# Patient Record
Sex: Female | Born: 1980 | Race: Black or African American | Hispanic: No | Marital: Married | Smoking: Never smoker
Health system: Southern US, Community
[De-identification: ages and names within clinical notes are randomized; demographics above are authoritative.]

## PROBLEM LIST (undated history)

## (undated) DIAGNOSIS — K589 Irritable bowel syndrome without diarrhea: Secondary | ICD-10-CM

## (undated) DIAGNOSIS — M25519 Pain in unspecified shoulder: Secondary | ICD-10-CM

## (undated) DIAGNOSIS — R002 Palpitations: Secondary | ICD-10-CM

## (undated) DIAGNOSIS — G8929 Other chronic pain: Secondary | ICD-10-CM

## (undated) DIAGNOSIS — K219 Gastro-esophageal reflux disease without esophagitis: Secondary | ICD-10-CM

## (undated) HISTORY — PX: ESOPHAGOGASTRECTOMY: SHX1528

## (undated) HISTORY — DX: Irritable bowel syndrome, unspecified: K58.9

## (undated) HISTORY — DX: Gastro-esophageal reflux disease without esophagitis: K21.9

## (undated) HISTORY — PX: SHOULDER SURGERY: SHX246

---
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
## 2002-06-23 ENCOUNTER — Encounter: Payer: Self-pay | Admitting: Emergency Medicine

## 2002-06-23 ENCOUNTER — Emergency Department (HOSPITAL_COMMUNITY): Admission: EM | Admit: 2002-06-23 | Discharge: 2002-06-23 | Payer: Self-pay | Admitting: Emergency Medicine

## 2006-03-03 ENCOUNTER — Emergency Department: Payer: Self-pay | Admitting: Emergency Medicine

## 2006-09-10 ENCOUNTER — Ambulatory Visit: Payer: Self-pay | Admitting: Emergency Medicine

## 2006-09-19 ENCOUNTER — Inpatient Hospital Stay: Payer: Self-pay | Admitting: Obstetrics and Gynecology

## 2007-12-01 ENCOUNTER — Ambulatory Visit: Payer: Self-pay | Admitting: Family Medicine

## 2008-04-30 ENCOUNTER — Ambulatory Visit: Payer: Self-pay | Admitting: Internal Medicine

## 2009-07-15 ENCOUNTER — Ambulatory Visit: Payer: Self-pay

## 2009-07-16 ENCOUNTER — Ambulatory Visit: Payer: Self-pay | Admitting: Unknown Physician Specialty

## 2009-07-22 ENCOUNTER — Observation Stay: Payer: Self-pay

## 2009-07-26 ENCOUNTER — Observation Stay: Payer: Self-pay | Admitting: Obstetrics and Gynecology

## 2009-08-30 ENCOUNTER — Inpatient Hospital Stay: Payer: Self-pay

## 2010-02-02 ENCOUNTER — Emergency Department: Payer: Self-pay | Admitting: Emergency Medicine

## 2010-07-27 ENCOUNTER — Ambulatory Visit: Payer: Self-pay | Admitting: Internal Medicine

## 2010-08-19 HISTORY — PX: CHOLECYSTECTOMY: SHX55

## 2010-08-28 ENCOUNTER — Emergency Department: Payer: Self-pay | Admitting: Unknown Physician Specialty

## 2010-09-09 ENCOUNTER — Ambulatory Visit: Payer: Self-pay | Admitting: Surgery

## 2010-09-13 LAB — PATHOLOGY REPORT

## 2010-10-31 ENCOUNTER — Ambulatory Visit: Payer: Self-pay | Admitting: Surgery

## 2011-01-20 ENCOUNTER — Ambulatory Visit: Payer: Self-pay | Admitting: Gastroenterology

## 2013-06-05 ENCOUNTER — Ambulatory Visit: Payer: Self-pay

## 2013-06-05 LAB — CBC WITH DIFFERENTIAL/PLATELET
BASOS PCT: 0.2 %
Basophil #: 0 10*3/uL (ref 0.0–0.1)
EOS ABS: 0 10*3/uL (ref 0.0–0.7)
EOS PCT: 0.2 %
HCT: 41.8 % (ref 35.0–47.0)
HGB: 14 g/dL (ref 12.0–16.0)
Lymphocyte #: 0.2 10*3/uL — ABNORMAL LOW (ref 1.0–3.6)
Lymphocyte %: 2 %
MCH: 30 pg (ref 26.0–34.0)
MCHC: 33.6 g/dL (ref 32.0–36.0)
MCV: 89 fL (ref 80–100)
Monocyte #: 0.3 x10 3/mm (ref 0.2–0.9)
Monocyte %: 2.7 %
Neutrophil #: 10.2 10*3/uL — ABNORMAL HIGH (ref 1.4–6.5)
Neutrophil %: 94.9 %
PLATELETS: 155 10*3/uL (ref 150–440)
RBC: 4.69 10*6/uL (ref 3.80–5.20)
RDW: 13.3 % (ref 11.5–14.5)
WBC: 10.8 10*3/uL (ref 3.6–11.0)

## 2013-06-05 LAB — COMPREHENSIVE METABOLIC PANEL
ALBUMIN: 3.9 g/dL (ref 3.4–5.0)
ANION GAP: 8 (ref 7–16)
AST: 14 U/L — AB (ref 15–37)
Alkaline Phosphatase: 99 U/L
BUN: 18 mg/dL (ref 7–18)
Bilirubin,Total: 1.2 mg/dL — ABNORMAL HIGH (ref 0.2–1.0)
CALCIUM: 8.9 mg/dL (ref 8.5–10.1)
CHLORIDE: 103 mmol/L (ref 98–107)
CO2: 27 mmol/L (ref 21–32)
Creatinine: 1.02 mg/dL (ref 0.60–1.30)
EGFR (African American): >60
GLUCOSE: 89 mg/dL (ref 65–99)
Osmolality: 277 (ref 275–301)
POTASSIUM: 4.1 mmol/L (ref 3.5–5.1)
SGPT (ALT): 18 U/L (ref 12–78)
Sodium: 138 mmol/L (ref 136–145)
Total Protein: 7.8 g/dL (ref 6.4–8.2)

## 2013-06-05 LAB — URINALYSIS, COMPLETE
Blood: NEGATIVE
GLUCOSE, UR: NEGATIVE mg/dL (ref 0–75)
KETONE: NEGATIVE
Leukocyte Esterase: NEGATIVE
NITRITE: NEGATIVE
Ph: 6 (ref 4.5–8.0)
Specific Gravity: 1.02 (ref 1.003–1.030)
WBC UR: NONE SEEN /HPF (ref 0–5)

## 2013-06-05 LAB — AMYLASE: AMYLASE: 48 U/L (ref 25–115)

## 2013-06-05 LAB — LIPASE, BLOOD: Lipase: 68 U/L — ABNORMAL LOW (ref 73–393)

## 2013-06-05 LAB — PREGNANCY, URINE: PREGNANCY TEST, URINE: NEGATIVE m[IU]/mL

## 2013-12-10 ENCOUNTER — Ambulatory Visit: Payer: Self-pay | Admitting: Physician Assistant

## 2013-12-10 LAB — URINALYSIS, COMPLETE
Glucose,UR: NEGATIVE
LEUKOCYTE ESTERASE: NEGATIVE
NITRITE: NEGATIVE
PH: 6 (ref 5.0–8.0)
Protein: NEGATIVE
Squamous Epithelial: NONE SEEN
WBC UR: NONE SEEN /HPF (ref 0–5)

## 2013-12-10 LAB — PREGNANCY, URINE: Pregnancy Test, Urine: NEGATIVE m[IU]/mL

## 2013-12-12 LAB — URINE CULTURE

## 2015-05-31 LAB — HM PAP SMEAR: HM Pap smear: NEGATIVE

## 2017-03-20 DIAGNOSIS — G8929 Other chronic pain: Secondary | ICD-10-CM

## 2017-03-20 DIAGNOSIS — M549 Dorsalgia, unspecified: Secondary | ICD-10-CM

## 2017-03-20 HISTORY — DX: Dorsalgia, unspecified: M54.9

## 2017-03-20 HISTORY — DX: Other chronic pain: G89.29

## 2017-08-12 ENCOUNTER — Other Ambulatory Visit: Payer: Self-pay

## 2017-08-12 ENCOUNTER — Ambulatory Visit (INDEPENDENT_AMBULATORY_CARE_PROVIDER_SITE_OTHER): Payer: BLUE CROSS/BLUE SHIELD

## 2017-08-12 ENCOUNTER — Ambulatory Visit
Admission: EM | Admit: 2017-08-12 | Discharge: 2017-08-12 | Disposition: A | Discharge status: Home / Self Care | Payer: BLUE CROSS/BLUE SHIELD | Attending: Family Medicine | Admitting: Family Medicine

## 2017-08-12 DIAGNOSIS — S300XXA Contusion of lower back and pelvis, initial encounter: Secondary | ICD-10-CM | POA: Insufficient documentation

## 2017-08-12 DIAGNOSIS — M533 Sacrococcygeal disorders, not elsewhere classified: Secondary | ICD-10-CM | POA: Diagnosis not present

## 2017-08-12 DIAGNOSIS — M545 Low back pain: Secondary | ICD-10-CM

## 2017-08-12 DIAGNOSIS — W108XXA Fall (on) (from) other stairs and steps, initial encounter: Secondary | ICD-10-CM | POA: Diagnosis not present

## 2017-08-12 HISTORY — DX: Pain in unspecified shoulder: M25.519

## 2017-08-12 HISTORY — DX: Palpitations: R00.2

## 2017-08-12 MED ORDER — KETOROLAC TROMETHAMINE 60 MG/2ML IM SOLN
60.0000 mg | Freq: Once | INTRAMUSCULAR | Status: AC
  Administered 2017-08-12: 60 mg via INTRAMUSCULAR

## 2017-08-12 MED ORDER — CYCLOBENZAPRINE HCL 10 MG PO TABS: 10.0000 mg | ORAL_TABLET | Freq: Every day | ORAL | 0 refills | Status: DC

## 2017-08-12 MED ORDER — KETOROLAC TROMETHAMINE 10 MG PO TABS: 10.0000 mg | ORAL_TABLET | Freq: Three times a day (TID) | ORAL | 0 refills | Status: DC | PRN

## 2017-08-12 NOTE — ED Provider Notes (Signed)
[facility]    CSN: [id number] Arrival date & time: 08/12/17  0801     History   Chief Complaint Chief Complaint  Patient presents with  . Fall    HPI Sarah Obrien is a 37 y.o. female.   37 yo female with a c/o low back pain and left buttock pain after falling down some stairs at home yesterday while chasing her daughter. States stairs are carpeted. C/o bruise to left butt cheek. Denies any numbness/tingling.   The history is provided by the patient.  Fall     Past Medical History:  Diagnosis Date  . Palpitations   . Shoulder pain     There are no active problems to display for this patient.   Past Surgical History:  Procedure Laterality Date  . CHOLECYSTECTOMY    . SHOULDER SURGERY      OB History   None      Home Medications    Prior to Admission medications   Medication Sig Start Date End Date Taking? Authorizing Provider  cyclobenzaprine (FLEXERIL) 10 MG tablet Take 1 tablet (10 mg total) by mouth at bedtime. 08/12/17   Payton Mccallum, MD  DUEXIS 800-26.6 MG TABS Take 1 tablet(s) 3 times a day by oral route for 30 days. 07/06/17   [provider]  ketorolac (TORADOL) 10 MG tablet Take 1 tablet (10 mg total) by mouth every 8 (eight) hours as needed. 08/12/17   Payton Mccallum, MD  levonorgestrel (MIRENA, 52 MG,) 20 MCG/24HR IUD by Intrauterine route.    [provider]  LYRICA 75 MG capsule Take by mouth daily. 07/03/17   [provider]  metoprolol succinate (TOPROL-XL) 25 MG 24 hr tablet TAKE 1/2 A TABLET BY MOUTH ONCE DAILY 06/15/17   [provider]    Family History Family History  Problem Relation Age of Onset  . Hypertension Mother   . Hyperlipidemia Father     Social History Social History   Tobacco Use  . Smoking status: Never Smoker  . Smokeless tobacco: Never Used  Substance Use Topics  . Alcohol use: Not Currently  . Drug use: Never     Allergies    Oxycodone-acetaminophen   Review of Systems Review of Systems   Physical Exam Triage Vital Signs ED Triage Vitals  Enc Vitals Group     BP 08/12/17 0813 118/70     Pulse Rate 08/12/17 0813 61     Resp 08/12/17 0813 18     Temp 08/12/17 0813 98 F (36.7 C)     Temp Source 08/12/17 0813 Oral     SpO2 08/12/17 0813 97 %     Weight 08/12/17 0814 186 lb (84.4 kg)     Height 08/12/17 0814  (1.753 m)     Head Circumference --      Peak Flow --      Pain Score 08/12/17 0814 5     Pain Loc --      Pain Edu? --      Excl. in GC? --    No data found.  Updated Vital Signs BP 118/70 (BP Location: Left Arm)   Pulse 61   Temp 98 F (36.7 C) (Oral)   Resp 18   Ht  (1.753 m)   Wt 186 lb (84.4 kg)   SpO2 97%   BMI 27.47 kg/m   Visual Acuity Right Eye Distance:   Left Eye Distance:   Bilateral Distance:    Right  Eye Near:   Left Eye Near:    Bilateral Near:     Physical Exam  Constitutional: She appears well-developed and well-nourished. No distress.  Musculoskeletal:       Lumbar back: She exhibits tenderness, bony tenderness, swelling (mild to buttock area), pain and spasm. She exhibits normal range of motion, no edema, no deformity, no laceration and normal pulse.  Hematoma to left upper buttock  Neurological: She is alert. She displays normal reflexes. No sensory deficit. She exhibits normal muscle tone.  Skin: She is not diaphoretic.  Nursing note and vitals reviewed.    UC Treatments / Results  Labs (all labs ordered are listed, but only abnormal results are displayed) Labs Reviewed - No data to display  EKG None  Radiology Dg Lumbar Spine Complete  Result Date: 08/12/2017 CLINICAL DATA:  Fall with left back and sacral pain extending into the posterior thigh. EXAM: LUMBAR SPINE - COMPLETE 4+ VIEW COMPARISON:  CT abdomen from 02/02/2010 FINDINGS: There is no evidence of lumbar spine fracture. Alignment is normal. Intervertebral disc spaces are  maintained. IMPRESSION: Negative. Electronically Signed   By: Gaylyn Rong M.D.   On: 08/12/2017 09:09   Dg Sacrum/coccyx  Result Date: 08/12/2017 CLINICAL DATA:  Fall with sacral pain EXAM: SACRUM AND COCCYX - 2+ VIEW COMPARISON:  None. FINDINGS: No obvious discontinuity of the arcuate lines of the sacrum. No other visible discontinuities of the pelvic ring. Note is made of an IUD. IMPRESSION: 1. No definite sacral fracture. If there is a high clinical suspicion of occult fracture, CT offers greater diagnostic sensitivity. Electronically Signed   By: Gaylyn Rong M.D.   On: 08/12/2017 09:11    Procedures Procedures (including critical care time)  Medications Ordered in UC Medications  ketorolac (TORADOL) injection 60 mg (60 mg Intramuscular Given 08/12/17 0836)    Initial Impression / Assessment and Plan / UC Course  I have reviewed the triage vital signs and the nursing notes.  Pertinent labs & imaging results that were available during my care of the patient were reviewed by me and considered in my medical decision making (see chart for details).     Final Clinical Impressions(s) / UC Diagnoses   Final diagnoses:  Contusion of sacrum, initial encounter  Contusion of lower back, initial encounter   Discharge Instructions   None    ED Prescriptions    Medication Sig Dispense Auth. Provider   cyclobenzaprine (FLEXERIL) 10 MG tablet Take 1 tablet (10 mg total) by mouth at bedtime. 30 tablet Payton Mccallum, MD   ketorolac (TORADOL) 10 MG tablet Take 1 tablet (10 mg total) by mouth every 8 (eight) hours as needed. 15 tablet Payton Mccallum, MD     1. X-ray results and  diagnosis reviewed with patient; toradol  im x1 given 2. rx as per orders above; reviewed possible side effects, interactions, risks and benefits  3. Recommend supportive treatment with rest, ice 4. Follow-up prn if symptoms worsen or don't improve  Controlled Substance Prescriptions [place] Controlled  Substance Registry consulted? Not Applicable   Payton Mccallum, MD 08/12/17 [id number]

## 2017-08-12 NOTE — ED Triage Notes (Signed)
Pt reports she was chasing her children yesterday and fell down 6 carpeted steps and landed on her back and buttocks. Pain to left buttocks and low back pain 5/10. Large bruise to left buttock

## 2017-09-23 NOTE — Progress Notes (Signed)
Gynecology Annual Exam  [place], [facility]  Chief Complaint:  Chief Complaint  Patient presents with  . Gynecologic Exam    History of Present Illness: Sarah Obrien is a 37 y.o. Black female [id number] who presents for her annual exam. The patient has no complaints today. Uses Mycolog ointment under her breasts for fungal infection/itching. Would like to use powder instead  She is amenorrheic due to her Mirena IUD. She has no spotting Last pap smear: 05/31/2015, results were NIL/neg HRHPV   The patient is married and is sexually active. Her Mirena was inserted 05/22/2013. She does not have dyspareunia.  Since her last visit, she has had left shoulder arthroscopic surgery x 2  and continues to have limited ROM in her shoulder. She is going to PT. She also was begun on metoprolol 12.5 mgm for palpitations and an irregular heart rate.  Her past medical history is remarkable for GERD  The patient does perform self breast exams. Her last mammogram was NA.   There is no family history of breast cancer.   There is no family history of ovarian cancer.  The patient denies smoking.  She reports drinking alcohol. She has an occasional glass of wine.   She denies illegal drug use.  The patient reports exercising regularly. She has lost 30# since her last visit with exercise and limiting her CHO.  Her last lipid panel was done this year and it was normal.  The patient denies current symptoms of depression.    Review of Systems: Review of Systems  Constitutional: Negative for chills, fever and weight loss.  HENT: Negative for congestion, sinus pain and sore throat.   Eyes: Negative for blurred vision and pain.  Respiratory: Negative for hemoptysis, shortness of breath and wheezing.   Cardiovascular: Positive for palpitations (and irregular herat rate). Negative for chest pain and leg swelling.  Gastrointestinal: Negative for abdominal pain, blood in stool,  diarrhea, heartburn, nausea and vomiting.  Genitourinary: Negative for dysuria, frequency, hematuria and urgency.       Positive for amenorrhea  Musculoskeletal: Positive for joint pain (left shoulder). Negative for back pain and myalgias.  Skin: Negative for itching and rash.  Neurological: Negative for dizziness, tingling and headaches.  Endo/Heme/Allergies: Negative for environmental allergies and polydipsia. Does not bruise/bleed easily.       Negative for hirsutism   Psychiatric/Behavioral: Negative for depression. The patient is not nervous/anxious and does not have insomnia.     Past Medical History:  Past Medical History:  Diagnosis Date  . GERD (gastroesophageal reflux disease)   . Palpitations   . Shoulder pain     Past Surgical History:  Past Surgical History:  Procedure Laterality Date  . CHOLECYSTECTOMY  08/2010   LAP - DR. Michela PitcherELY  . ESOPHAGOGASTRECTOMY  9/20013   EPIGASTRIC PAIN  . SHOULDER SURGERY      Family History:  Family History  Problem Relation Age of Onset  . Hypertension Mother   . Hyperlipidemia Father   . Cancer Paternal Grandmother [phone]       LUNG  . Diabetes Paternal Grandfather        TYPE 2  . Stroke Paternal Grandfather     Social History:  Social History   Socioeconomic History  . Marital status: Married    Spouse name: Not on file  . Number of children: 2  . Years of education: 8418  . Highest education level: Master's degree (e.g., MA, MS,  MEng, MEd, MSW, [employer])  Occupational History  . Occupation: NURSE  Social Needs  . Financial resource strain: Not on file  . Food insecurity:    Worry: Not on file    Inability: Not on file  . Transportation needs:    Medical: Not on file    Non-medical: Not on file  Tobacco Use  . Smoking status: Never Smoker  . Smokeless tobacco: Never Used  Substance and Sexual Activity  . Alcohol use: Not Currently  . Drug use: Never  . Sexual activity: Yes    Birth control/protection: IUD  Lifestyle    . Physical activity:    Days per week: Not on file    Minutes per session: Not on file  . Stress: Not on file  Relationships  . Social connections:    Talks on phone: Not on file    Gets together: Not on file    Attends religious service: Not on file    Active member of club or organization: Not on file    Attends meetings of clubs or organizations: Not on file    Relationship status: Not on file  . Intimate partner violence:    Fear of current or ex partner: Not on file    Emotionally abused: Not on file    Physically abused: Not on file    Forced sexual activity: Not on file  Other Topics Concern  . Not on file  Social History Narrative  . Not on file    Allergies:  Allergies  Allergen Reactions  . Oxycodone-Acetaminophen Itching  . Oxycodone Itching    Medications: Current Outpatient Medications on File Prior to Visit  Medication Sig Dispense Refill  . chlorzoxazone (PARAFON) 500 MG tablet Take 1 tablet by mouth as needed.    . Ibuprofen-Famotidine 800-26.6 MG TABS Take 1 tablet by mouth 3 (three) times daily as needed.    Marland Kitchen levonorgestrel (MIRENA, 52 MG,) 20 MCG/24HR IUD by Intrauterine route.    Marland Kitchen LYRICA 75 MG capsule Take by mouth daily.  0  . Melatonin 3 MG TABS Take 1 tablet by mouth as needed.    . metoprolol succinate (TOPROL-XL) 25 MG 24 hr tablet TAKE 1/2 A TABLET BY MOUTH ONCE DAILY  11  . vitamin C (ASCORBIC ACID) 500 MG tablet Take 1 tablet by mouth daily.     No current facility-administered medications on file prior to visit.     And Vitamin D3 2000 IU daily  Physical Exam Vitals: BP 130/60   Pulse 66   Ht 5\' 9"  (1.753 m)   Wt 191 lb (86.6 kg)   LMP  (LMP Unknown)   BMI 28.21 kg/m   General: BF in NAD HEENT: normocephalic, anicteric Neck: no thyroid enlargement, no palpable nodules, no cervical lymphadenopathy  Pulmonary: No increased work of breathing, CTAB Cardiovascular: RRR, without murmur  Breast: Breast symmetrical, no tenderness, no  palpable nodules or masses, no skin or nipple retraction present, no nipple discharge.  No axillary, infraclavicular or supraclavicular lymphadenopathy. Abdomen: Soft, non-tender, non-distended.  Umbilicus without lesions.  No hepatomegaly or masses palpable. No evidence of hernia. Genitourinary:  External: Normal external female genitalia.  Normal urethral meatus, normal Bartholin's and Skene's glands.    Vagina: Normal vaginal mucosa, no evidence of prolapse.    Cervix: Grossly normal in appearance, no bleeding, non-tender, IUD strings present  Uterus: Anteflexed, normal size, shape, and consistency, mobile, and non-tender  Adnexa: No adnexal masses, non-tender  Rectal: deferred  Lymphatic: no  evidence of inguinal lymphadenopathy Extremities: no edema, erythema, or tenderness Neurologic: Grossly intact Psychiatric: mood appropriate, affect full     Assessment: 37 y.o. [id number] annual gyn exam.  Plan:   1) Breast cancer screening - recommend monthly self breast exam. Begin mammograms at age 89.  2) Cervical cancer screening - Pap was done.   3) Contraception - Mirena expires next year. Will probably have it replaced  4) Routine healthcare maintenance including cholesterol and diabetes screening managed by PCP   5) RTO in 1 year  Farrel Conners, [place]

## 2017-09-24 ENCOUNTER — Encounter: Payer: Self-pay | Admitting: Certified Nurse Midwife

## 2017-09-24 ENCOUNTER — Other Ambulatory Visit (HOSPITAL_COMMUNITY)
Admission: RE | Admit: 2017-09-24 | Discharge: 2017-09-24 | Disposition: A | Discharge status: Home / Self Care | Payer: BLUE CROSS/BLUE SHIELD | Source: Ambulatory Visit | Attending: Obstetrics and Gynecology | Admitting: Obstetrics and Gynecology

## 2017-09-24 ENCOUNTER — Ambulatory Visit (INDEPENDENT_AMBULATORY_CARE_PROVIDER_SITE_OTHER): Payer: BLUE CROSS/BLUE SHIELD | Admitting: Certified Nurse Midwife

## 2017-09-24 VITALS — BP 130/60 | HR 66 | Ht 69.0 in | Wt 191.0 lb

## 2017-09-24 DIAGNOSIS — Z01419 Encounter for gynecological examination (general) (routine) without abnormal findings: Secondary | ICD-10-CM | POA: Diagnosis not present

## 2017-09-24 DIAGNOSIS — Z124 Encounter for screening for malignant neoplasm of cervix: Secondary | ICD-10-CM | POA: Diagnosis not present

## 2017-09-24 DIAGNOSIS — M25512 Pain in left shoulder: Secondary | ICD-10-CM | POA: Insufficient documentation

## 2017-09-24 DIAGNOSIS — Z309 Encounter for contraceptive management, unspecified: Secondary | ICD-10-CM | POA: Insufficient documentation

## 2017-09-24 MED ORDER — NYSTATIN 100000 UNIT/GM EX POWD: Freq: Two times a day (BID) | CUTANEOUS | 2 refills | Status: DC | PRN

## 2017-09-27 LAB — CYTOLOGY - PAP: Diagnosis: NEGATIVE

## 2017-10-05 ENCOUNTER — Ambulatory Visit
Admission: EM | Admit: 2017-10-05 | Discharge: 2017-10-05 | Disposition: A | Discharge status: Home / Self Care | Payer: BLUE CROSS/BLUE SHIELD | Attending: Family Medicine | Admitting: Family Medicine

## 2017-10-05 ENCOUNTER — Encounter: Payer: Self-pay | Admitting: Emergency Medicine

## 2017-10-05 ENCOUNTER — Other Ambulatory Visit: Payer: Self-pay

## 2017-10-05 DIAGNOSIS — S161XXA Strain of muscle, fascia and tendon at neck level, initial encounter: Secondary | ICD-10-CM

## 2017-10-05 MED ORDER — METAXALONE 800 MG PO TABS: 800.0000 mg | ORAL_TABLET | Freq: Three times a day (TID) | ORAL | 0 refills | Status: DC

## 2017-10-05 MED ORDER — NAPROXEN 500 MG PO TABS: 500.0000 mg | ORAL_TABLET | Freq: Two times a day (BID) | ORAL | 0 refills | Status: DC

## 2017-10-05 NOTE — Discharge Instructions (Signed)
The patient is advised to apply ice or cold packs intermittently as needed to relieve pain. Use  Caution while taking muscle relaxers.  Do not perform activities requiring concentration or judgment and do not drive.
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
## 2018-12-09 ENCOUNTER — Telehealth: Payer: Self-pay | Admitting: Certified Nurse Midwife

## 2018-12-09 NOTE — Telephone Encounter (Signed)
Patient is schedule for Mirena replacement on 12/27/18 with [facility]

## 2018-12-11 NOTE — Telephone Encounter (Signed)
Noted. Will order to arrive by apt date/time. Per Epic apt w/CLG not [facility]

## 2018-12-27 ENCOUNTER — Encounter: Payer: Self-pay | Admitting: Certified Nurse Midwife

## 2018-12-27 ENCOUNTER — Other Ambulatory Visit: Payer: Self-pay

## 2018-12-27 ENCOUNTER — Ambulatory Visit (INDEPENDENT_AMBULATORY_CARE_PROVIDER_SITE_OTHER): Payer: BC Managed Care – PPO | Admitting: Certified Nurse Midwife

## 2018-12-27 VITALS — BP 100/70 | HR 67 | Ht 69.0 in | Wt 214.0 lb

## 2018-12-27 DIAGNOSIS — Z30433 Encounter for removal and reinsertion of intrauterine contraceptive device: Secondary | ICD-10-CM | POA: Diagnosis not present

## 2018-12-27 NOTE — Progress Notes (Signed)
    GYNECOLOGY OFFICE PROCEDURE NOTE  Sarah Obrien is a 38 y.o. [id number] here for Mirena IUD replacement. No GYN concerns.  Last pap smear was on 09/24/2017 and was normal. Current IUD was placed 05/22/2013 and she has been satisfied with the Mirena. Has been amenorrheic with the IUD  IUD Procedure Note Patient identified, informed consent performed, consent signed.  Patient aware of risks of irregular bleeding, cramping, infection, expulsion, malpositioning or misplacement of the IUD outside the uterus which may require further procedure such as laparoscopy. Time out was performed.    On bimanual exam, uterus was Retroverted. Speculum placed in the vagina.  IUD strings visualized at the cervix. The IUD strings were grasped with a packing forceps and the IUD was removed easily and was intact. Cervix then prepped with Betadine x 2. Cervix was sprayed with Hurricaine anesthetic and  grasped posteriorly with a single tooth tenaculum.  Uterus sounded to 6 cm.  Mirena  IUD placed per manufacturer's recommendations.  Strings trimmed to 3 cm. Tenaculum was removed, and silver nitrate was applied to tenaculum sites for hemostasis.  Patient tolerated procedure well.   Patient was given post-procedure instructions.  To follow up in 4 weeks for IUD check and annual exam.  Dalia Heading, CNM 12/27/18

## 2019-01-10 ENCOUNTER — Other Ambulatory Visit (INDEPENDENT_AMBULATORY_CARE_PROVIDER_SITE_OTHER): Payer: BC Managed Care – PPO

## 2019-01-10 ENCOUNTER — Other Ambulatory Visit: Payer: Self-pay | Admitting: Certified Nurse Midwife

## 2019-01-10 ENCOUNTER — Encounter: Payer: Self-pay | Admitting: Certified Nurse Midwife

## 2019-01-10 ENCOUNTER — Ambulatory Visit (INDEPENDENT_AMBULATORY_CARE_PROVIDER_SITE_OTHER): Payer: BC Managed Care – PPO | Admitting: Certified Nurse Midwife

## 2019-01-10 ENCOUNTER — Other Ambulatory Visit: Payer: Self-pay

## 2019-01-10 VITALS — BP 130/80 | HR 72 | Ht 69.0 in | Wt 217.0 lb

## 2019-01-10 DIAGNOSIS — R1031 Right lower quadrant pain: Secondary | ICD-10-CM

## 2019-01-10 DIAGNOSIS — Z975 Presence of (intrauterine) contraceptive device: Secondary | ICD-10-CM

## 2019-01-10 DIAGNOSIS — Z3202 Encounter for pregnancy test, result negative: Secondary | ICD-10-CM | POA: Diagnosis not present

## 2019-01-10 DIAGNOSIS — N8311 Corpus luteum cyst of right ovary: Secondary | ICD-10-CM

## 2019-01-10 DIAGNOSIS — R102 Pelvic and perineal pain: Secondary | ICD-10-CM

## 2019-01-10 LAB — POCT URINE PREGNANCY: Preg Test, Ur: NEGATIVE

## 2019-01-10 NOTE — Progress Notes (Signed)
  History of Present Illness:  Sarah Obrien is a 38 y.o. that had a Mirena IUD replaced approximately 2 weeks ago. Last night she began having some pain in her suprapubic area, to the right of midline that would radiate to her back. The pain has continued thru today. She took 800 mgm of ibuprofen last night with a little relief. She denies fever, diarrhea, constipation, dysuria or hematuria. Does admit to some nausea, urinary frequency and a mucoid discharge.  A provider at her work this AM ran a urinalysis and she reports it was normal.   PMHx: She  has a past medical history of Chronic back pain (2019), GERD (gastroesophageal reflux disease), Palpitations, and Shoulder pain. Also,  has a past surgical history that includes Shoulder surgery; Cholecystectomy (08/2010); and Esophagogastrectomy (9/20013)., family history includes Diabetes in her paternal grandfather; Hyperlipidemia in her father; Hypertension in her mother; Lung cancer (age of onset: 90) in her paternal grandmother; Stroke in her paternal grandfather.,  reports that she has never smoked. She has never used smokeless tobacco. She reports previous alcohol use. She reports that she does not use drugs.  She has a current medication list which includes the following prescription(s): levonorgestrel, lyrica, melatonin, metaxalone, methocarbamol, metoprolol succinate, naproxen, nystatin, vitamin c, and vitamin d (ergocalciferol). Also, is allergic to oxycodone-acetaminophen and oxycodone.  Review of Systems  Constitutional: Negative for chills, fever and weight loss.  HENT: Negative for congestion, sinus pain and sore throat.   Eyes: Negative for blurred vision and pain.  Respiratory: Negative for hemoptysis, shortness of breath and wheezing.   Cardiovascular: Negative for chest pain, palpitations and leg swelling.  Gastrointestinal: Positive for nausea. Negative for abdominal pain, blood in stool, diarrhea, heartburn and vomiting.   Genitourinary: Positive for frequency. Negative for dysuria, hematuria and urgency.       Positive for mucoid vaginal discharge  Musculoskeletal: Positive for back pain. Negative for joint pain and myalgias.  Skin: Negative for itching and rash.  Neurological: Negative for dizziness, tingling and headaches.  Endo/Heme/Allergies: Negative for environmental allergies and polydipsia. Does not bruise/bleed easily.       Negative for hirsutism   Psychiatric/Behavioral: Negative for depression. The patient is not nervous/anxious and does not have insomnia.     Physical Exam:  BP 130/80   Pulse 72   Ht 5\' 9"  (1.753 m)   Wt 217 lb (98.4 kg)   LMP  (LMP Unknown)   BMI 32.05 kg/m  Body mass index is 32.05 kg/m. Constitutional: Well nourished, well developed female in no acute distress.  Abdomen: diffusely non tender to palpation, non distended, and no masses, hernias Neuro: Grossly intact Psych:  Normal mood and affect.    Pelvic exam: External/BUS: no lesion, no discharge Vagina: no lesions, no bleeding Cervix: Two IUD strings present seen coming from the cervical os, mobile, NT Uterus: NSSC, mobile, NT Adnexa: no masses, NT  Pelvic ultrasound was performed. On ultrasound there was a possible corpus luteal cyst on the right ovary and a small amount of FF in the CDS, and otherwise was normal. IUD was in place  Assessment: Right lower abdominal pain possibly due to ovulatory pain. No evidence of endometritis. IUD in place.  R/O urolithiasis  Doubt appy due to the location of the pain   Plan: Recommend going to ER for worsening or persistent pain If this is ovulatory pain, it should resolve or decrease over the next 24 hours Dalia Heading, CNM

## 2019-01-28 ENCOUNTER — Ambulatory Visit: Payer: BC Managed Care – PPO | Admitting: Certified Nurse Midwife

## 2019-02-27 ENCOUNTER — Other Ambulatory Visit: Payer: Self-pay

## 2019-02-27 ENCOUNTER — Encounter: Payer: Self-pay | Admitting: Certified Nurse Midwife

## 2019-02-27 ENCOUNTER — Ambulatory Visit (INDEPENDENT_AMBULATORY_CARE_PROVIDER_SITE_OTHER): Payer: BC Managed Care – PPO | Admitting: Certified Nurse Midwife

## 2019-02-27 VITALS — BP 120/80 | HR 60 | Temp 96.3°F | Ht 69.0 in | Wt 221.0 lb

## 2019-02-27 DIAGNOSIS — R002 Palpitations: Secondary | ICD-10-CM | POA: Insufficient documentation

## 2019-02-27 DIAGNOSIS — R7989 Other specified abnormal findings of blood chemistry: Secondary | ICD-10-CM | POA: Insufficient documentation

## 2019-02-27 DIAGNOSIS — Z01419 Encounter for gynecological examination (general) (routine) without abnormal findings: Secondary | ICD-10-CM

## 2019-02-27 DIAGNOSIS — K219 Gastro-esophageal reflux disease without esophagitis: Secondary | ICD-10-CM | POA: Insufficient documentation

## 2019-02-27 DIAGNOSIS — Z1239 Encounter for other screening for malignant neoplasm of breast: Secondary | ICD-10-CM

## 2019-02-27 DIAGNOSIS — Z30431 Encounter for routine checking of intrauterine contraceptive device: Secondary | ICD-10-CM

## 2019-02-27 NOTE — Progress Notes (Signed)
Gynecology Annual Exam  PCP: [facility]  Chief Complaint:  Chief Complaint  Patient presents with  . Gynecologic Exam  . Follow-up    iud    History of Present Illness: Sarah Obrien is a 38 y.o. Black female [id number] who presents for her annual exam nad IUD check. The patient has no complaints today.   She is amenorrheic due to her Mirena IUD. She has no spotting Last pap smear: 09/24/2017, results were NIL.   The patient is married and is sexually active. She had her Mirena replaced 12/27/2018. She was seen about 2 weeks after her MIrena was replaced for lower abdominal pain. Her IUD was in place and an ultrasound was normal. There was a question whether there was a ruptured ovarian cyst. The pain resolved, but she reports some transient lower abdominal pains on occasion. She does not have dyspareunia  Since her last annual exam, she continues to have chronic shoulder pain and lower back pain and has been prescribed low dose naltrexone.  She was also started on Pepcid for GERD and vitamin D3 for low vitamin D3.  The patient does perform self breast exams. Her last mammogram was NA.   There is no family history of breast cancer.   There is no family history of ovarian cancer.  The patient denies smoking.  She does not drink alcohol.    She denies illegal drug use.  The patient reports exercising regularly. She uses the elliptical and also does water aerobics twice a week.  Her last lipid panel was done 2019 and it was normal.  The patient denies current symptoms of depression.    Review of Systems: Review of Systems  Constitutional: Positive for malaise/fatigue. Negative for chills, fever and weight loss.  HENT: Negative for congestion, sinus pain and sore throat.   Eyes: Negative for blurred vision and pain.  Respiratory: Negative for hemoptysis, shortness of breath and wheezing.   Cardiovascular: Positive for palpitations (and irregular herat  rate,). Negative for chest pain and leg swelling.  Gastrointestinal: Positive for constipation and heartburn. Negative for abdominal pain, blood in stool, diarrhea, nausea and vomiting.  Genitourinary: Negative for dysuria, frequency, hematuria and urgency.       Positive for amenorrhea  Musculoskeletal: Positive for back pain and joint pain (left shoulder). Negative for myalgias.  Skin: Negative for itching and rash.  Neurological: Positive for headaches. Negative for dizziness and tingling.  Endo/Heme/Allergies: Positive for environmental allergies. Negative for polydipsia. Does not bruise/bleed easily.       Negative for hirsutism   Psychiatric/Behavioral: Negative for depression. The patient is not nervous/anxious and does not have insomnia.     Past Medical History:  Past Medical History:  Diagnosis Date  . Chronic back pain 2019  . GERD (gastroesophageal reflux disease)   . Palpitations   . Shoulder pain     Past Surgical History:  Past Surgical History:  Procedure Laterality Date  . CHOLECYSTECTOMY  08/2010   LAP - DR. Michela Pitcher  . ESOPHAGOGASTRECTOMY  9/20013   EPIGASTRIC PAIN  . SHOULDER SURGERY      Family History:  Family History  Problem Relation Age of Onset  . Hypertension Mother   . Hyperlipidemia Father   . Lung cancer Paternal Grandmother 72       LUNG  . Diabetes Paternal Grandfather        TYPE 2  . Stroke Paternal Grandfather     Social History:  Social History   Socioeconomic History  . Marital status: Married    Spouse name: Not on file  . Number of children: 2  . Years of education: 1  . Highest education level: Master's degree (e.g., MA, MS, MEng, MEd, MSW, MBA)  Occupational History  . Occupation: NURSE  Tobacco Use  . Smoking status: Never Smoker  . Smokeless tobacco: Never Used  Substance and Sexual Activity  . Alcohol use: Not Currently  . Drug use: Never  . Sexual activity: Yes    Birth control/protection: I.U.D.  Other Topics  Concern  . Not on file  Social History Narrative  . Not on file   Social Determinants of Health   Financial Resource Strain:   . Difficulty of Paying Living Expenses: Not on file  Food Insecurity:   . Worried About Charity fundraiser in the Last Year: Not on file  . Ran Out of Food in the Last Year: Not on file  Transportation Needs:   . Lack of Transportation (Medical): Not on file  . Lack of Transportation (Non-Medical): Not on file  Physical Activity:   . Days of Exercise per Week: Not on file  . Minutes of Exercise per Session: Not on file  Stress:   . Feeling of Stress : Not on file  Social Connections:   . Frequency of Communication with Friends and Family: Not on file  . Frequency of Social Gatherings with Friends and Family: Not on file  . Attends Religious Services: Not on file  . Active Member of Clubs or Organizations: Not on file  . Attends Archivist Meetings: Not on file  . Marital Status: Not on file  Intimate Partner Violence:   . Fear of Current or Ex-Partner: Not on file  . Emotionally Abused: Not on file  . Physically Abused: Not on file  . Sexually Abused: Not on file    Allergies:  Allergies  Allergen Reactions  . Oxycodone-Acetaminophen Itching  . Oxycodone Itching    Medications:   Current Outpatient Medications:  .  Cholecalciferol (VITAMIN D) 125 MCG (5000 UT) CAPS, Take 1 tablet by mouth daily., Disp: , Rfl:  .  famotidine (PEPCID) 20 MG tablet, Take 1 tablet by mouth daily., Disp: , Rfl:  .  levonorgestrel (MIRENA, 52 MG,) 20 MCG/24HR IUD, by Intrauterine route., Disp: , Rfl:  .  LYRICA 75 MG capsule, Take by mouth daily., Disp: , Rfl: 0 .  Magnesium Carbonate (MAGNESIUM GLUCONATE) 54mg /22ml syringe, Take 1 tablet by mouth daily., Disp: , Rfl:  .  Melatonin 3 MG TABS, Take 1 tablet by mouth as needed., Disp: , Rfl:  .  metoprolol succinate (TOPROL-XL) 25 MG 24 hr tablet, TAKE 1/2 A TABLET BY MOUTH ONCE DAILY, Disp: , Rfl: 11 .   NONFORMULARY OR COMPOUNDED ITEM, Take 1 tablet by mouth daily. Naltrexone HCL 4.5mg  one qd, Disp: , Rfl:  .  nystatin (MYCOSTATIN/NYSTOP) powder, Apply topically 2 (two) times daily as needed., Disp: 15 g, Rfl: 2 .  vitamin C (ASCORBIC ACID) 500 MG tablet, Take 1 tablet by mouth daily., Disp: , Rfl:   Physical Exam Vitals: BP 120/80   Pulse 60   Temp (!) 96.3 F (35.7 C)   Ht 5\' 9"  (1.753 m)   Wt 221 lb (100.2 kg)   LMP  (LMP Unknown)   BMI 32.64 kg/m   General: BF in NAD HEENT: normocephalic, anicteric Neck: no thyroid enlargement, no palpable nodules, no cervical lymphadenopathy  Pulmonary:  No increased work of breathing, CTAB Cardiovascular: RRR, without murmur  Breast: Breast symmetrical, no tenderness, no palpable nodules or masses, no skin or nipple retraction present, no nipple discharge.  No axillary, infraclavicular or supraclavicular lymphadenopathy. Abdomen: Soft, non-tender, non-distended.  Umbilicus without lesions.  No hepatomegaly or masses palpable. No evidence of hernia. Genitourinary:  External: Normal external female genitalia.  Normal urethral meatus, normal Bartholin's and Skene's glands.    Vagina: Normal vaginal mucosa, no evidence of prolapse.    Cervix: Grossly normal in appearance, no bleeding, everted, non-tender, IUD strings present  Uterus: Anteflexed, normal size, shape, and consistency, mobile, and non-tender  Adnexa: No adnexal masses, non-tender  Rectal: deferred  Lymphatic: no evidence of inguinal lymphadenopathy Extremities: no edema, erythema, or tenderness Neurologic: Grossly intact Psychiatric: mood appropriate, affect full     Assessment: 38 y.o. [id number] annual gyn exam.  Plan:   1) Breast cancer screening - recommend monthly self breast exam. Begin mammograms at age 38.  2) Cervical cancer screening - Pap not done. Patient to have Pap smears every 3 years. Next due in 2 years  3) Contraception - Mirena expires in 6 years.   4)  Routine healthcare maintenance including cholesterol and diabetes screening managed by PCP   5) RTO in 1 year  Farrel Connersolleen Destin Kittler, [place]

## 2020-01-30 IMAGING — CR DG SACRUM/COCCYX 2+V
3 series · 3 of 3 positions shown · non-contrast
Comparison: None.

CLINICAL DATA: Fall with sacral pain

EXAM:
SACRUM AND COCCYX - 2+ VIEW

[coccyx ap]
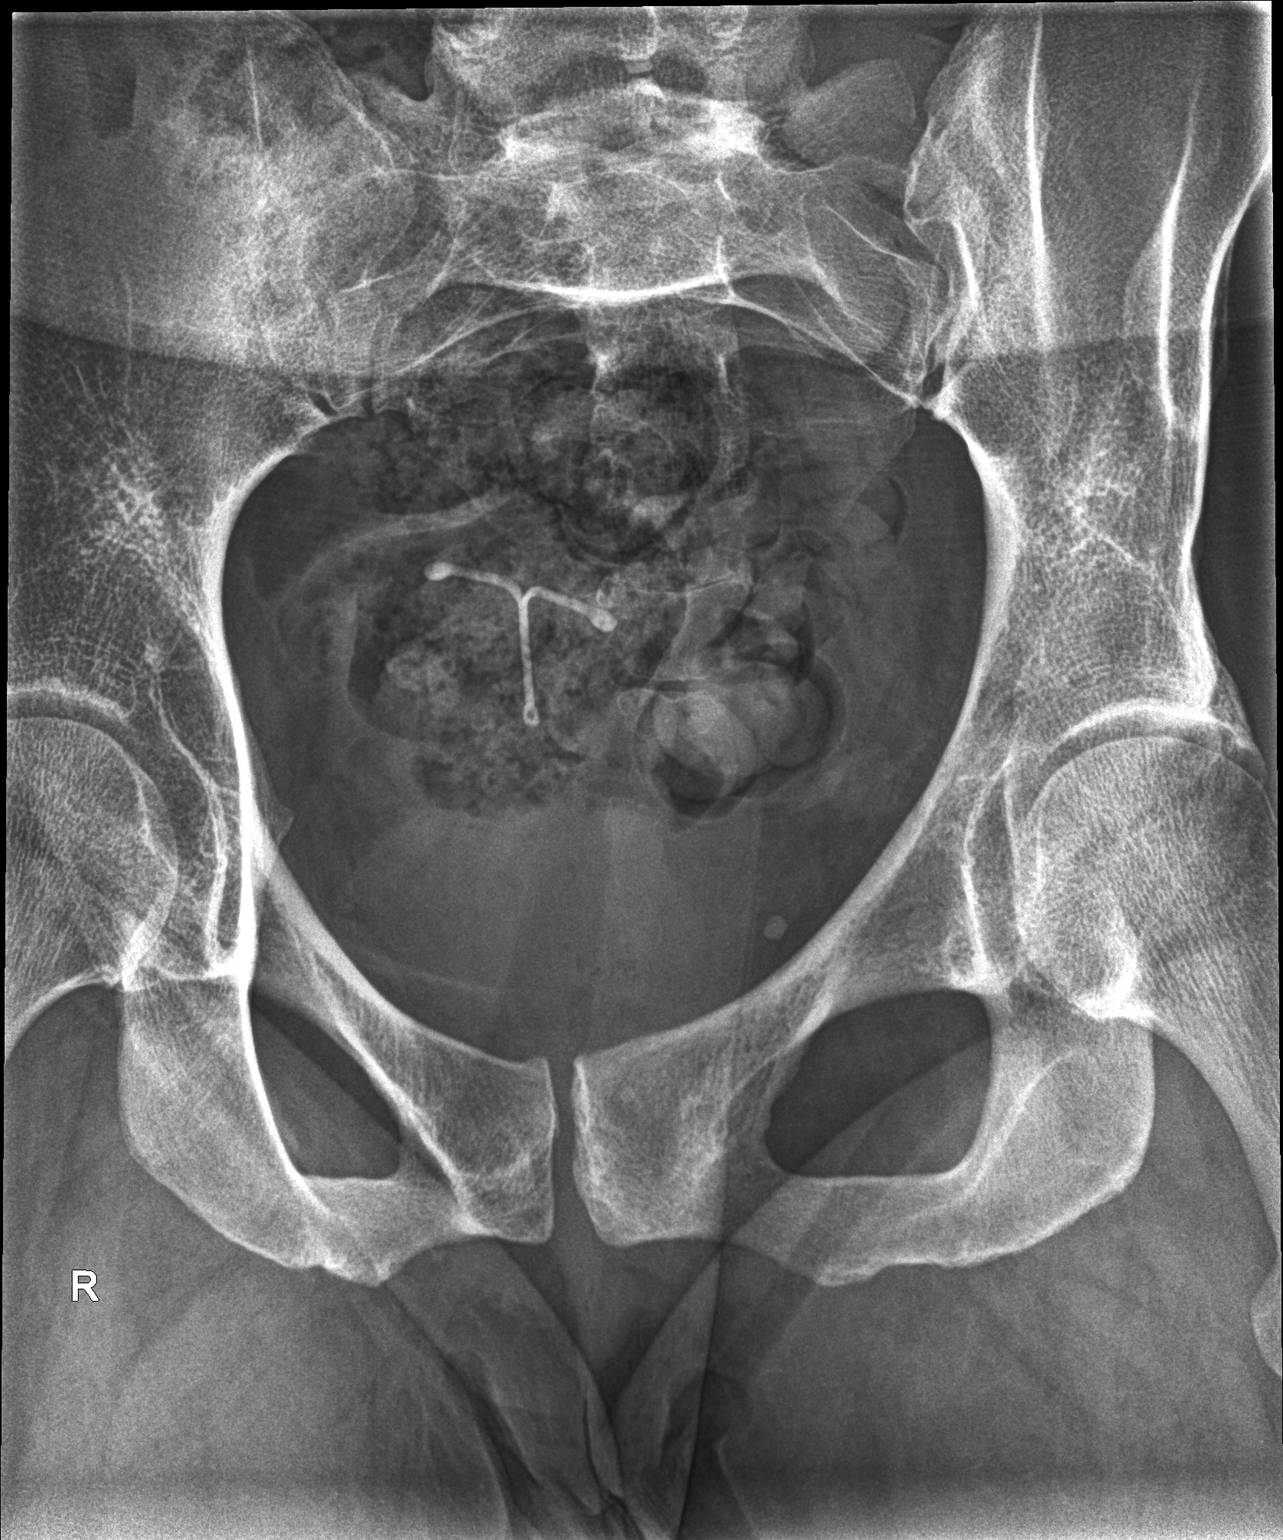

[sacrum ap]
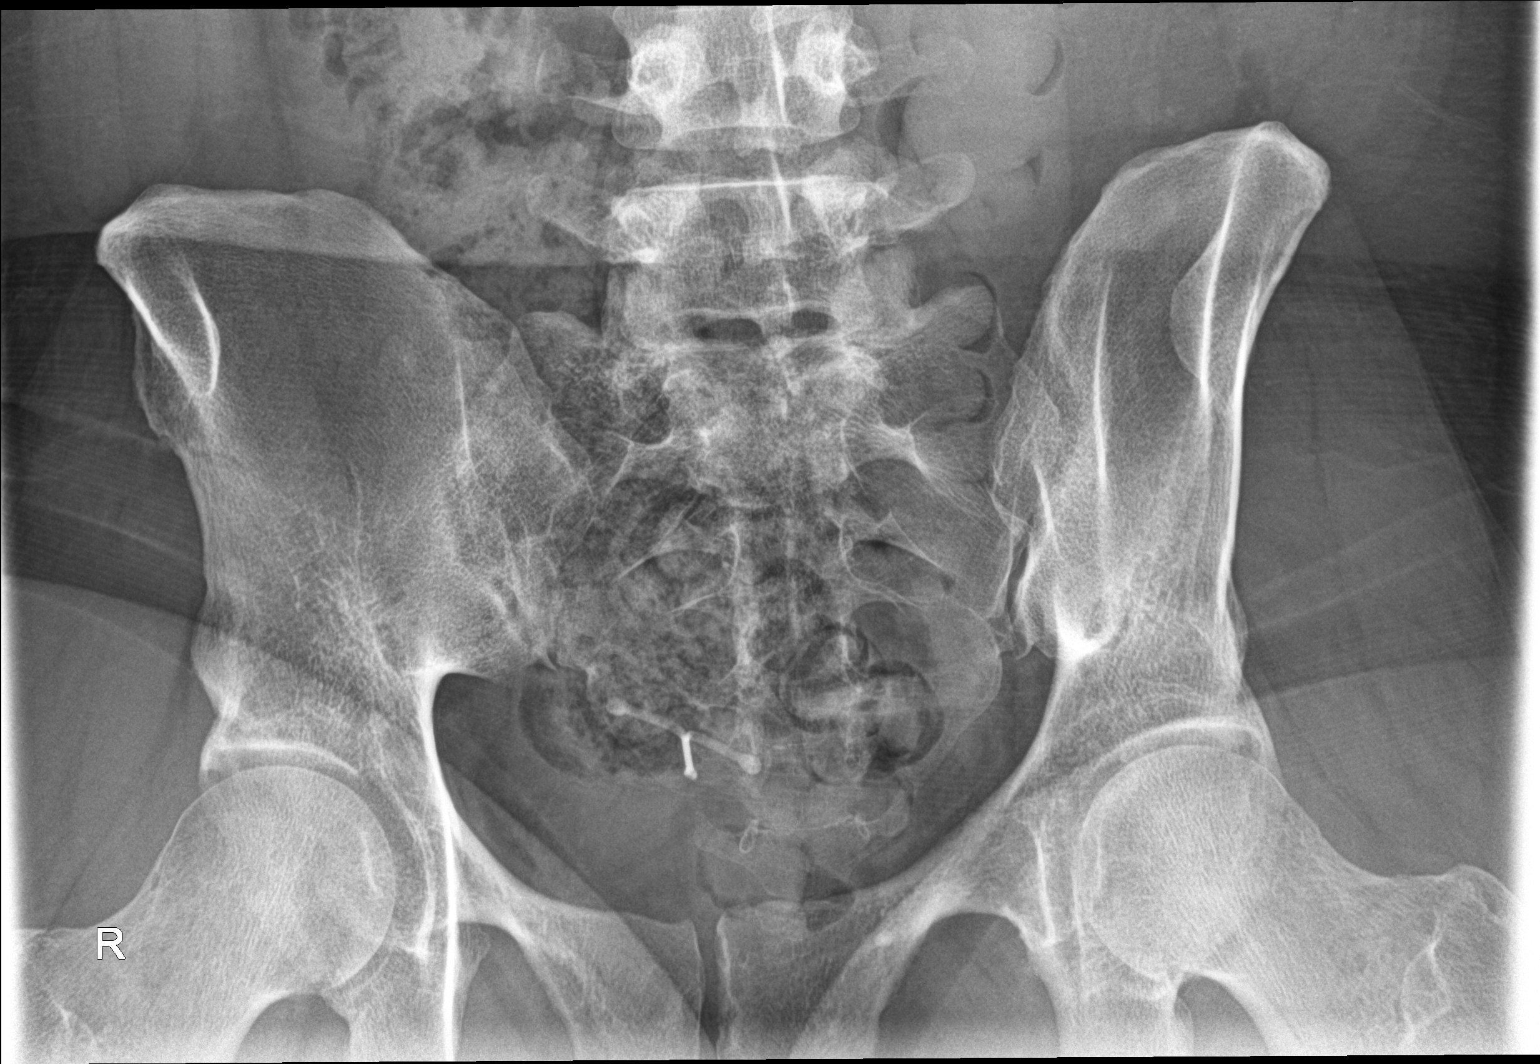

[sacrum lat]
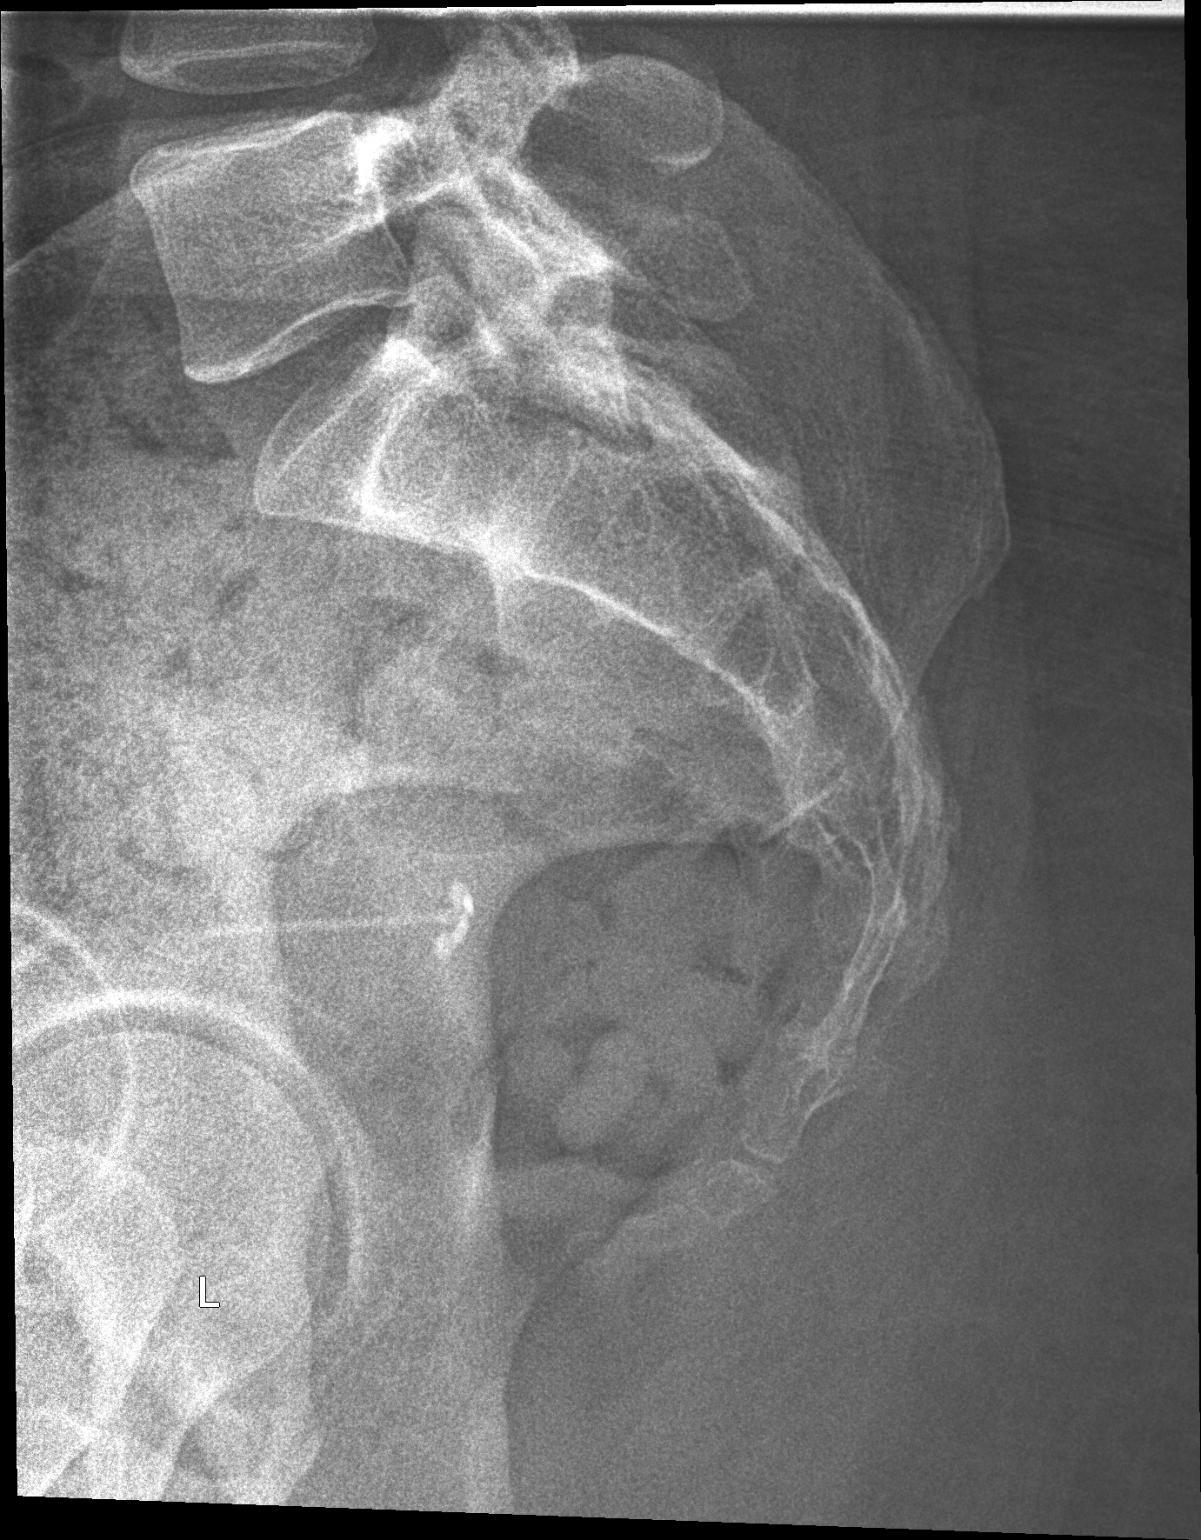

[3 of 3 positions shown; findings below may reference images not displayed]

FINDINGS: No obvious discontinuity of the arcuate lines of the sacrum. No
other visible discontinuities of the pelvic ring. Note is made of an
IUD..
IMPRESSION: 1. No definite sacral fracture. If there is a high clinical
suspicion of occult fracture, CT offers greater diagnostic
sensitivity.

## 2020-01-30 IMAGING — CR DG LUMBAR SPINE COMPLETE 4+V
4 series · 4 of 4 positions shown · non-contrast
Comparison: CT abdomen from 02/02/2010

CLINICAL DATA: Fall with left back and sacral pain extending into
the posterior thigh.

EXAM:
LUMBAR SPINE - COMPLETE 4+ VIEW

[l-spine ap]
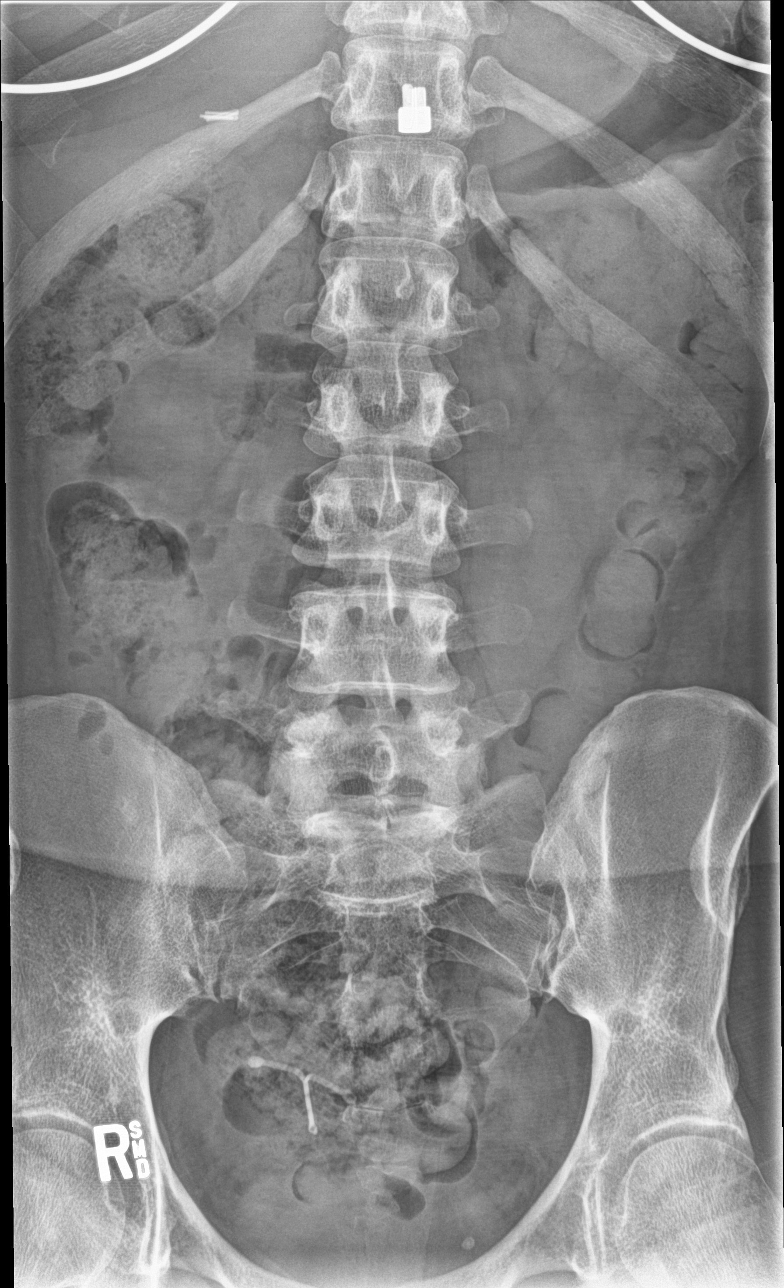

[l-spine obl (1 of 2)]
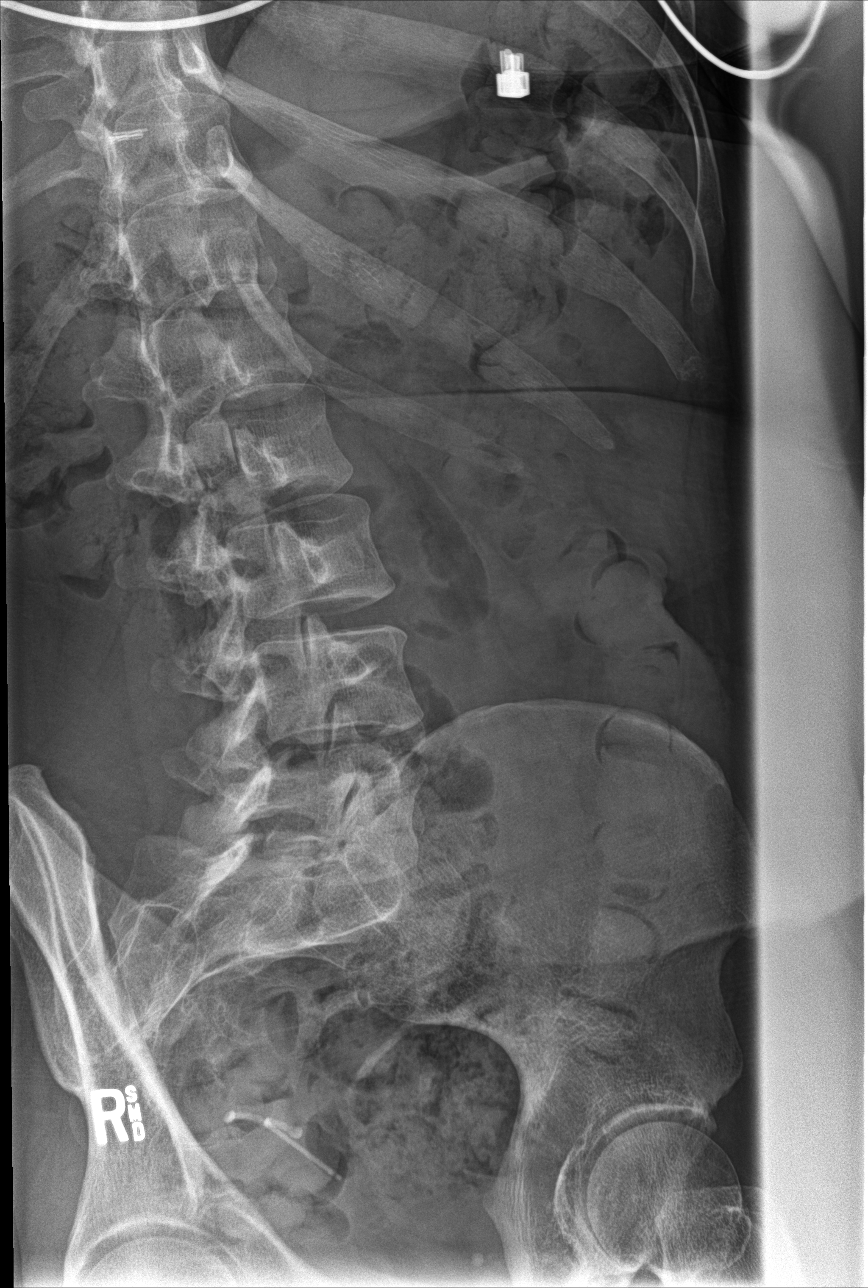

[l-spine obl (2 of 2)]
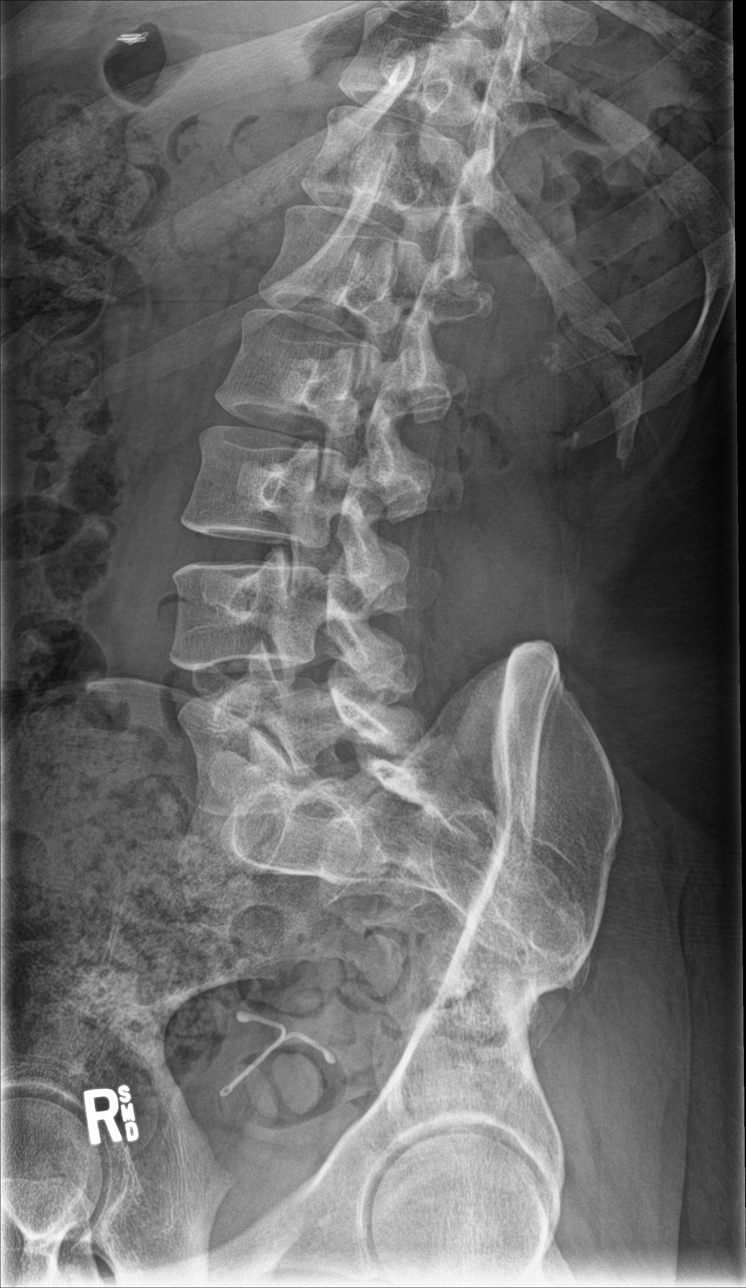

[l-spine lat]
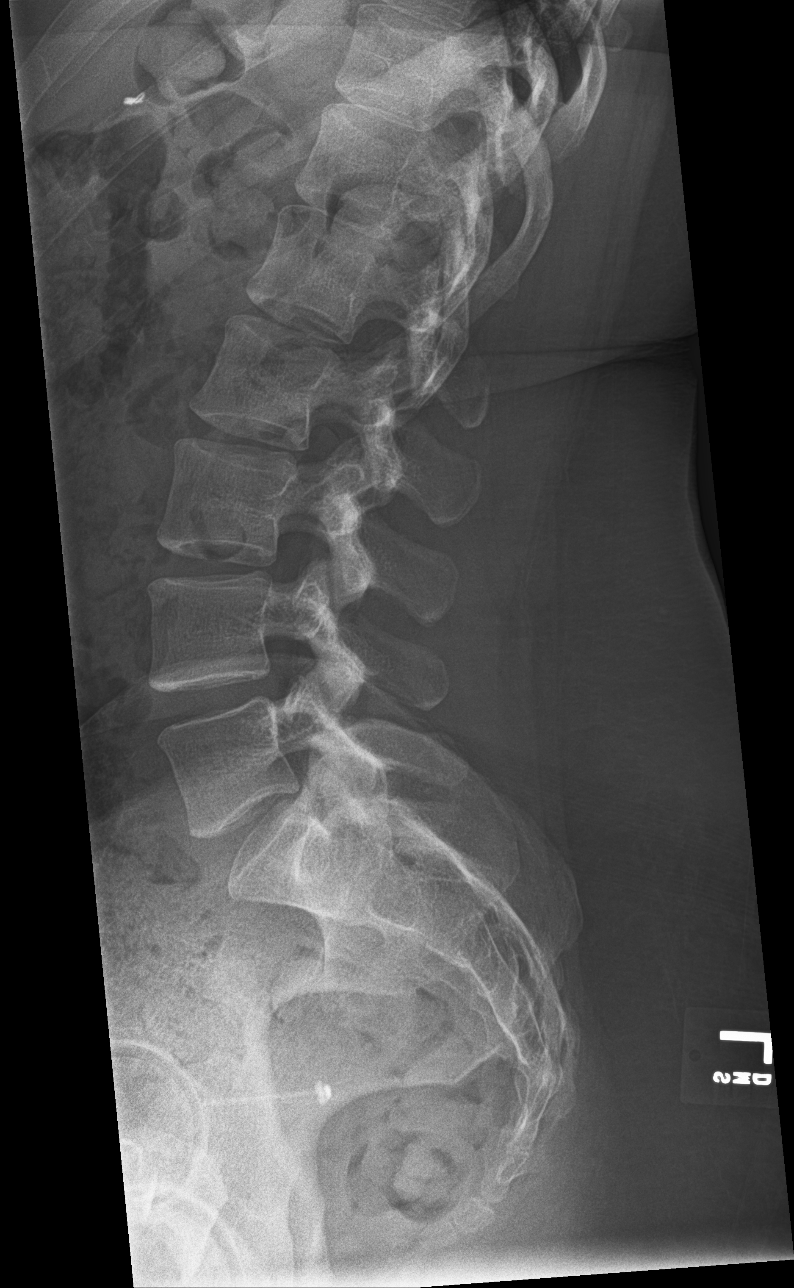

[4 of 4 positions shown; findings below may reference images not displayed]

FINDINGS: There is no evidence of lumbar spine fracture. Alignment is normal.
Intervertebral disc spaces are maintained.
IMPRESSION: Negative.

## 2020-03-11 ENCOUNTER — Encounter: Payer: Self-pay | Admitting: Obstetrics and Gynecology

## 2020-03-11 ENCOUNTER — Ambulatory Visit (INDEPENDENT_AMBULATORY_CARE_PROVIDER_SITE_OTHER): Payer: BC Managed Care – PPO | Admitting: Obstetrics and Gynecology

## 2020-03-11 ENCOUNTER — Other Ambulatory Visit: Payer: Self-pay

## 2020-03-11 VITALS — BP 134/70 | Ht 69.0 in | Wt 214.0 lb

## 2020-03-11 DIAGNOSIS — Z01419 Encounter for gynecological examination (general) (routine) without abnormal findings: Secondary | ICD-10-CM | POA: Diagnosis not present

## 2020-03-11 DIAGNOSIS — Z1239 Encounter for other screening for malignant neoplasm of breast: Secondary | ICD-10-CM | POA: Diagnosis not present

## 2020-03-11 DIAGNOSIS — Z30431 Encounter for routine checking of intrauterine contraceptive device: Secondary | ICD-10-CM

## 2020-03-11 NOTE — Progress Notes (Signed)
Gynecology Annual Exam   [facility]P: [facility]  Chief Complaint:  Chief Complaint  Patient presents with  . Gynecologic Exam    Annual - no concerns. RM 3    History of Present Illness: Patient is a 39 y.o. [id number] presents for annual exam. The patient has no complaints today.   LMP: No LMP recorded. (Menstrual status: IUD). Amenorrhea on levonorgestrel IUD  The patient is sexually active. She currently uses IUD (Mirena) for contraception. She denies dyspareunia.  The patient does perform self breast exams.  There is no notable family history of breast or ovarian cancer in her family.  The patient wears seatbelts: yes.   The patient has regular exercise: not asked.    The patient denies current symptoms of depression.    Review of Systems: Review of Systems  Constitutional: Negative for chills and fever.  HENT: Negative for congestion.   Respiratory: Negative for cough and shortness of breath.   Cardiovascular: Negative for chest pain and palpitations.  Gastrointestinal: Positive for constipation. Negative for abdominal pain, diarrhea, heartburn, nausea and vomiting.  Genitourinary: Negative for dysuria, frequency and urgency.  Skin: Negative for itching and rash.  Neurological: Negative for dizziness and headaches.  Endo/Heme/Allergies: Negative for polydipsia.  Psychiatric/Behavioral: Negative for depression.    Past Medical History:  Patient Active Problem List   Diagnosis Date Noted  . Low vitamin D level 02/27/2019  . GERD (gastroesophageal reflux disease) 02/27/2019  . Palpitations 02/27/2019  . Contraceptive management 09/24/2017    Mirena IUD placed 05/22/2013   . Shoulder pain, left 09/24/2017    Past Surgical History:  Past Surgical History:  Procedure Laterality Date  . CHOLECYSTECTOMY  08/2010   LAP - DR. Michela Pitcher  . ESOPHAGOGASTRECTOMY  9/20013   EPIGASTRIC PAIN  . SHOULDER SURGERY      Gynecologic History:  No LMP recorded. (Menstrual  status: IUD). Contraception: 01/10/2019 Mirena IUD Last Pap: Results were: 09/24/2017 NILM   Obstetric History: [id number]  Family History:  Family History  Problem Relation Age of Onset  . Hypertension Mother   . Hyperlipidemia Father   . Lung cancer Paternal Grandmother 25       LUNG  . Diabetes Paternal Grandfather        TYPE 2  . Stroke Paternal Grandfather     Social History:  Social History   Socioeconomic History  . Marital status: Married    Spouse name: Not on file  . Number of children: 2  . Years of education: 64  . Highest education level: Master's degree (e.g., MA, MS, MEng, MEd, MSW, MBA)  Occupational History  . Occupation: NURSE  Tobacco Use  . Smoking status: Never Smoker  . Smokeless tobacco: Never Used  Vaping Use  . Vaping Use: Never used  Substance and Sexual Activity  . Alcohol use: Not Currently  . Drug use: Never  . Sexual activity: Yes    Birth control/protection: I.U.D.  Other Topics Concern  . Not on file  Social History Narrative  . Not on file   Social Determinants of Health   Financial Resource Strain: Not on file  Food Insecurity: Not on file  Transportation Needs: Not on file  Physical Activity: Not on file  Stress: Not on file  Social Connections: Not on file  Intimate Partner Violence: Not on file    Allergies:  Allergies  Allergen Reactions  . Oxycodone-Acetaminophen Itching  . Oxycodone Itching    Medications: Prior to Admission  medications   Medication Sig Start Date End Date Taking? Authorizing Provider  Cholecalciferol (VITAMIN D) 125 MCG (5000 UT) CAPS Take 1 tablet by mouth daily.    [provider]  famotidine (PEPCID) 20 MG tablet Take 1 tablet by mouth daily. 02/06/19 02/06/20  [provider]  levonorgestrel (MIRENA, 52 MG,) 20 MCG/24HR IUD by Intrauterine route.    [provider]  LYRICA 75 MG capsule Take by mouth daily. 07/03/17   [provider]  Magnesium Carbonate  (MAGNESIUM GLUCONATE) 54mg /51ml syringe Take 1 tablet by mouth daily.    [provider]  Melatonin 3 MG TABS Take 1 tablet by mouth as needed.    [provider]  metoprolol succinate (TOPROL-XL) 25 MG 24 hr tablet TAKE 1/2 A TABLET BY MOUTH ONCE DAILY 06/15/17   [provider]  NONFORMULARY OR COMPOUNDED ITEM Take 1 tablet by mouth daily. Naltrexone HCL 4.5mg  one qd    [provider]  nystatin (MYCOSTATIN/NYSTOP) powder Apply topically 2 (two) times daily as needed. 09/24/17   11/25/17, CNM  vitamin C (ASCORBIC ACID) 500 MG tablet Take 1 tablet by mouth daily.    [provider]    Physical Exam Vitals: Blood pressure 134/70, height 5\' 9"  (1.753 m), weight 214 lb (97.1 kg).   General: NAD HEENT: normocephalic, anicteric Thyroid: no enlargement, no palpable nodules Pulmonary: No increased work of breathing, CTAB Cardiovascular: RRR, distal pulses 2+ Breast: Breast symmetrical, no tenderness, no palpable nodules or masses, no skin or nipple retraction present, no nipple discharge.  No axillary or supraclavicular lymphadenopathy. Abdomen: NABS, soft, non-tender, non-distended.  Umbilicus without lesions.  No hepatomegaly, splenomegaly or masses palpable. No evidence of hernia  Genitourinary:  External: Normal external female genitalia.  Normal urethral meatus, normal Bartholin's and Skene's glands.    Vagina: Normal vaginal mucosa, no evidence of prolapse.    Cervix: Grossly normal in appearance, no bleeding, IUD string visualized  Uterus: Non-enlarged, mobile, normal contour.  No CMT  Adnexa: ovaries non-enlarged, no adnexal masses  Rectal: deferred  Lymphatic: no evidence of inguinal lymphadenopathy Extremities: no edema, erythema, or tenderness Neurologic: Grossly intact Psychiatric: mood appropriate, affect full  Female chaperone present for pelvic and breast  portions of the physical exam    Assessment: 39 y.o. G2P1102  routine annual exam  Plan: Problem List Items Addressed This Visit   None   Visit Diagnoses    Encounter for gynecological examination without abnormal finding    -  Primary   Breast screening       IUD check up          1) STI screening  was notoffered and therefore not obtained  2)  ASCCP guidelines and rational discussed.  Patient opts for every 3 years screening interval  3) Contraception - the patient is currently using  IUD.  She is happy with her current form of contraception and plans to continue  4) Routine healthcare maintenance including cholesterol, diabetes screening discussed managed by PCP  5) Return in about 1 year (around 03/11/2021) for annual.   20, MD, 03/13/2021 OB/GYN, [facility] 03/11/2020, 3:28 PM

## 2021-02-18 NOTE — Telephone Encounter (Signed)
Mirena rcvd/charged 12/27/2018

## 2021-08-16 ENCOUNTER — Encounter: Payer: Self-pay | Admitting: Licensed Practical Nurse

## 2021-08-16 ENCOUNTER — Ambulatory Visit (INDEPENDENT_AMBULATORY_CARE_PROVIDER_SITE_OTHER): Payer: BC Managed Care – PPO | Admitting: Licensed Practical Nurse

## 2021-08-16 ENCOUNTER — Other Ambulatory Visit: Payer: Self-pay | Admitting: Licensed Practical Nurse

## 2021-08-16 ENCOUNTER — Other Ambulatory Visit (HOSPITAL_COMMUNITY)
Admission: RE | Admit: 2021-08-16 | Discharge: 2021-08-16 | Disposition: A | Discharge status: Home / Self Care | Payer: BC Managed Care – PPO | Source: Ambulatory Visit | Attending: Licensed Practical Nurse | Admitting: Licensed Practical Nurse

## 2021-08-16 VITALS — BP 120/72 | Ht 69.0 in

## 2021-08-16 DIAGNOSIS — Z01419 Encounter for gynecological examination (general) (routine) without abnormal findings: Secondary | ICD-10-CM | POA: Diagnosis present

## 2021-08-16 DIAGNOSIS — Z1239 Encounter for other screening for malignant neoplasm of breast: Secondary | ICD-10-CM

## 2021-08-16 DIAGNOSIS — Z124 Encounter for screening for malignant neoplasm of cervix: Secondary | ICD-10-CM

## 2021-08-16 DIAGNOSIS — N898 Other specified noninflammatory disorders of vagina: Secondary | ICD-10-CM | POA: Diagnosis present

## 2021-08-16 DIAGNOSIS — Z1231 Encounter for screening mammogram for malignant neoplasm of breast: Secondary | ICD-10-CM

## 2021-08-16 NOTE — Progress Notes (Signed)
Gynecology Annual Exam  PCP: [facility]  Chief Complaint:  Chief Complaint  Patient presents with   Gynecologic Exam    History of Present Illness: Patient is a 41 y.o. [name] presents for annual exam. The patient reports having what she thinks is a yeast infection on her skin near her groin, she has tried OTC nystatin powder, it continues to itch.  She has also noticed an increased vaginal discharge over the last month, denies any odor, irritation, or spotting.   LMP: No LMP recorded. (Menstrual status: IUD). Intermenstrual Bleeding: no Postcoital Bleeding: no Dysmenorrhea: no   The patient is sexually active with 1 female partner. She currently uses IUD for contraception. She denies dyspareunia.  The patient does perform self breast exams.  There is no notable family history of breast or ovarian cancer in her family.  The patient wears seatbelts: yes.   The patient has regular exercise: yes.  Uses the elliptical daily.  Has made significant lifestyle changes and has lost 40lbs Dental: goes every 6 months PCP: [facility], last seen August 2022 Wears Contacts: last eye exam August 2022  The patient denies current symptoms of depression.   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   Review of Systems: Review of Systems  Constitutional: Negative.   Respiratory: Negative.    Cardiovascular: Negative.   Gastrointestinal:        Sees GI   Genitourinary: Negative.   Musculoskeletal: Negative.   Neurological: Negative.   Endo/Heme/Allergies: Negative.   Psychiatric/Behavioral: Negative.     Past Medical History:  Patient Active Problem List   Diagnosis Date Noted   Low vitamin D level 02/27/2019   GERD (gastroesophageal reflux disease) 02/27/2019   Palpitations 02/27/2019   Contraceptive management 09/24/2017    Mirena IUD placed  05/22/2013     Shoulder pain, left 09/24/2017    Past Surgical History:  Past Surgical History:  Procedure Laterality Date   CHOLECYSTECTOMY  08/2010   LAP - DR. Michela Pitcher   ESOPHAGOGASTRECTOMY  9/20013   EPIGASTRIC PAIN   SHOULDER SURGERY      Gynecologic History:  No LMP recorded. (Menstrual status: IUD). Contraception: IUD Last Pap: Results were: 2019 no abnormalities  Last mammogram: never had   Obstetric History: G2P1102  Family History:  Family History  Problem Relation Age of Onset   Hypertension Mother    Hyperlipidemia Father    Lung cancer Paternal Grandmother 73       LUNG   Diabetes Paternal Grandfather        TYPE 2   Stroke Paternal Grandfather     Social History:  Social History   Socioeconomic History   Marital status: Married    Spouse name: Not on file   Number of children: 2   Years of education: 18   Highest education level: Master's degree (e.g., MA, MS, MEng, MEd, MSW, MBA)  Occupational History   Occupation: NURSE  Tobacco Use   Smoking status: Never   Smokeless tobacco: Never  Vaping Use   Vaping Use: Never used  Substance and Sexual Activity   Alcohol use: Not Currently   Drug use: Never   Sexual activity: Yes    Birth control/protection: I.U.D.  Other Topics Concern   Not on file  Social History Narrative   Not on file   Social Determinants of Health  Financial Resource Strain: Not on file  Food Insecurity: Not on file  Transportation Needs: Not on file  Physical Activity: Not on file  Stress: Not on file  Social Connections: Not on file  Intimate Partner Violence: Not on file    Allergies:  Allergies  Allergen Reactions   Oxycodone-Acetaminophen Itching   Oxycodone Itching    Medications: Prior to Admission medications   Medication Sig Start Date End Date Taking? Authorizing Provider  Cholecalciferol (VITAMIN D) 125 MCG (5000 UT) CAPS Take 1 tablet by mouth daily.   Yes [provider]  levonorgestrel  (MIRENA) 20 MCG/24HR IUD by Intrauterine route.   Yes [provider]  metoprolol succinate (TOPROL-XL) 25 MG 24 hr tablet TAKE 1/2 A TABLET BY MOUTH ONCE DAILY 06/15/17  Yes [provider]  NONFORMULARY OR COMPOUNDED ITEM Take 1 tablet by mouth daily. Naltrexone HCL 4.5mg  one qd   Yes [provider]  nystatin (MYCOSTATIN/NYSTOP) powder Apply topically 2 (two) times daily as needed. 09/24/17  Yes Farrel ConnersGutierrez, Colleen, CNM  famotidine (PEPCID) 20 MG tablet Take 1 tablet by mouth daily. 02/06/19 02/06/20  [provider]  LYRICA 75 MG capsule Take by mouth daily. 07/03/17   [provider]  Magnesium Carbonate (MAGNESIUM GLUCONATE) 54mg /245ml syringe Take 1 tablet by mouth daily.    [provider]  Melatonin 3 MG TABS Take 1 tablet by mouth as needed.    [provider]  vitamin C (ASCORBIC ACID) 500 MG tablet Take 1 tablet by mouth daily. Patient not taking: Reported on 08/16/2021    [provider]    Physical Exam Vitals: Blood pressure 120/72, height 5\' 9"  (1.753 m).  General: NAD HEENT: normocephalic, anicteric Pulmonary: No increased work of breathing, CTAB Cardiovascular: RRR, distal pulses 2+ Breast: Breast symmetrical, no tenderness, no palpable nodules or masses, no skin or nipple retraction present, no nipple discharge.  No axillary or supraclavicular lymphadenopathy. Abdomen: NABS, soft, non-tender, non-distended.  Umbilicus without lesions.  No hepatomegaly, splenomegaly or masses palpable. No evidence of hernia  Genitourinary:  External: Normal external female genitalia.  Normal urethral meatus, normal Bartholin's and Skene's glands.    Vagina: Normal vaginal mucosa, no evidence of prolapse.    Cervix: Grossly normal in appearance, no bleeding, moderates amount of creamy white discharge present.   Uterus: Non-enlarged, mobile, normal contour.  No CMT  Adnexa: ovaries non-enlarged, no adnexal masses  Rectal:  deferred  Lymphatic: no evidence of inguinal lymphadenopathy Extremities: no edema, erythema, or tenderness Neurologic: Grossly intact Psychiatric: mood appropriate, affect full Skin: 2cm discolored area in right groin,  Female chaperone present for pelvic and breast  portions of the physical exam    Assessment: 41 y.o. [id number] routine annual exam  Plan: Problem List Items Addressed This Visit   None   1) Mammogram - recommend yearly screening mammogram.  Mammogram Was ordered today   2) STI screening  wasoffered and declined  3) ASCCP guidelines and rational discussed.  Patient opts for every 5 years screening interval  4) Contraception - the patient is currently using  IUD.  She is happy with her current form of contraception and plans to continue  5) Colonoscopy -- Screening recommended starting at age 40 for average risk individuals, age 40 for individuals deemed at increased risk (including African Americans) and recommended to continue until age 40.  For patient age 40-85 individualized approach is recommended.  Gold standard screening is via colonoscopy, Cologuard screening is an acceptable alternative for patient  unwilling or unable to undergo colonoscopy.  "Colorectal cancer screening for average?risk adults: 2018 guideline update from the American Cancer Society"CA: A Cancer Journal for Clinicians: Aug 16, 2016 .  Had colonoscopy April of 2023 for IBS,   6) Routine healthcare maintenance including cholesterol, diabetes screening discussed managed by PCP  7) Return in about 1 year (around 08/17/2022).  8) Fungal infection: sulfa wash once a day, OTC antifungal cream   9) Increased vaginal discharge Aptima sent, reminded to changed out of work out clothing immediately after exercising.   Carie Caddy, CNM  [facility], [facility] 08/16/2021, 8:46 AM

## 2021-08-17 LAB — CERVICOVAGINAL ANCILLARY ONLY
Bacterial Vaginitis (gardnerella): NEGATIVE
Candida Glabrata: NEGATIVE
Candida Vaginitis: NEGATIVE
Comment: NEGATIVE
Comment: NEGATIVE
Comment: NEGATIVE

## 2021-08-19 LAB — CYTOLOGY - PAP
Comment: NEGATIVE
Diagnosis: NEGATIVE
Diagnosis: REACTIVE
High risk HPV: NEGATIVE

## 2021-08-22 ENCOUNTER — Ambulatory Visit
Admission: RE | Admit: 2021-08-22 | Discharge: 2021-08-22 | Disposition: A | Discharge status: Home / Self Care | Payer: BC Managed Care – PPO | Source: Ambulatory Visit | Attending: Licensed Practical Nurse | Admitting: Licensed Practical Nurse

## 2021-08-22 DIAGNOSIS — Z1231 Encounter for screening mammogram for malignant neoplasm of breast: Secondary | ICD-10-CM | POA: Insufficient documentation

## 2021-09-15 ENCOUNTER — Encounter: Payer: Self-pay | Admitting: Licensed Practical Nurse

## 2021-09-26 ENCOUNTER — Other Ambulatory Visit: Payer: Self-pay | Admitting: Licensed Practical Nurse

## 2021-09-26 MED ORDER — NYSTATIN 100000 UNIT/GM EX POWD: Freq: Two times a day (BID) | CUTANEOUS | 2 refills | Status: DC | PRN

## 2021-09-26 NOTE — Progress Notes (Signed)
Pt seen for annual on 5/30, fungal infection noted near groin, requesting script for Nystatin, script sent to pharmacy on file.  Carie Caddy, CNM  [facility], [facility]  09/26/21  10:23 AM

## 2022-08-04 ENCOUNTER — Other Ambulatory Visit: Payer: Self-pay | Admitting: Family Medicine

## 2022-08-04 DIAGNOSIS — Z1231 Encounter for screening mammogram for malignant neoplasm of breast: Secondary | ICD-10-CM

## 2022-08-28 ENCOUNTER — Ambulatory Visit
Admission: RE | Admit: 2022-08-28 | Discharge: 2022-08-28 | Disposition: A | Discharge status: Home / Self Care | Payer: BC Managed Care – PPO | Source: Ambulatory Visit | Attending: Family Medicine | Admitting: Family Medicine

## 2022-08-28 DIAGNOSIS — Z1231 Encounter for screening mammogram for malignant neoplasm of breast: Secondary | ICD-10-CM | POA: Diagnosis present

## 2022-12-29 ENCOUNTER — Ambulatory Visit: Payer: BC Managed Care – PPO | Admitting: Licensed Practical Nurse

## 2022-12-29 ENCOUNTER — Encounter: Payer: Self-pay | Admitting: Licensed Practical Nurse

## 2022-12-29 VITALS — BP 134/81 | HR 65 | Ht 69.0 in | Wt 160.2 lb

## 2022-12-29 DIAGNOSIS — R399 Unspecified symptoms and signs involving the genitourinary system: Secondary | ICD-10-CM | POA: Diagnosis not present

## 2022-12-29 DIAGNOSIS — Z01411 Encounter for gynecological examination (general) (routine) with abnormal findings: Secondary | ICD-10-CM | POA: Diagnosis not present

## 2022-12-29 DIAGNOSIS — Z01419 Encounter for gynecological examination (general) (routine) without abnormal findings: Secondary | ICD-10-CM

## 2022-12-29 LAB — POCT URINALYSIS DIPSTICK
Bilirubin, UA: NEGATIVE
Blood, UA: NEGATIVE
Glucose, UA: NEGATIVE
Ketones, UA: NEGATIVE
Leukocytes, UA: NEGATIVE
Nitrite, UA: NEGATIVE
Protein, UA: NEGATIVE
Spec Grav, UA: 1.015 (ref 1.010–1.025)
Urobilinogen, UA: 0.2 U/dL
pH, UA: 6.5 (ref 5.0–8.0)

## 2022-12-29 NOTE — Progress Notes (Signed)
Gynecology Annual Exam  [facility]P: [facility]  Chief Complaint:  Chief Complaint  Patient presents with   Gynecologic Exam    History of Present Illness: Patient is a 42 y.o. Sarah Obrien presents for annual exam. The patient has no complaints today.  But she has noticed her urine is cloudy, otherwise denies urinary symptoms   LMP: No LMP recorded. (Menstrual status: IUD).  Intermenstrual Bleeding: no Postcoital Bleeding: no Dysmenorrhea: no   The patient is sexually active with 1 female. She currently uses IUD for contraception. She denies dyspareunia.  The patient does perform self breast exams.  There is no notable family history of breast or ovarian cancer in her family.  The patient wears seatbelts: yes.   The patient has regular exercise: yes.  Likes to go to the gym   The patient denies current symptoms of depression.    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     Review of Systems: ROS see HPI   Past Medical History:  Patient Active Problem List   Diagnosis Date Noted Date Diagnosed   Low vitamin D level 02/27/2019    GERD (gastroesophageal reflux disease) 02/27/2019    Palpitations 02/27/2019    Contraceptive management 09/24/2017     Mirena IUD placed 05/22/2013    Shoulder pain, left 09/24/2017     Past Surgical History:  Past Surgical History:  Procedure Laterality Date   CHOLECYSTECTOMY  08/2010   LAP - DR. Michela Pitcher   ESOPHAGOGASTRECTOMY  9/20013   EPIGASTRIC PAIN   SHOULDER SURGERY      Gynecologic History:  No LMP recorded. (Menstrual status: IUD). Contraception: IUD Last Pap: Results were: 2023 NIL and HR  HPV negative  Last mammogram: 6/24 Results were: BI-RAD I  Obstetric History: [id number]  Family History:  Family History  Problem Relation Age of Onset   Hypertension Mother    Stroke Mother 29   Hyperlipidemia Father    Lung cancer Paternal Grandmother 30       LUNG   Diabetes Paternal Grandfather        TYPE 2   Stroke Paternal Grandfather     Social History:  Social History   Socioeconomic History   Marital status: Married    Spouse name: Not on file   Number of children: 2   Years of education: 18   Highest education level: Master's degree (e.g., MA, MS, MEng, MEd, MSW, MBA)  Occupational History   Occupation: NURSE  Tobacco Use   Smoking status: Never   Smokeless tobacco: Never  Vaping Use   Vaping status: Never Used  Substance and Sexual Activity   Alcohol use: Not Currently   Drug use: Never   Sexual activity: Yes    Birth control/protection: I.U.D.  Other Topics Concern   Not on file  Social History Narrative   Not on file   Social Determinants of Health   Financial Resource Strain: Not on file  Food Insecurity: Not on file  Transportation Needs: Not on file  Physical Activity: Not on file  Stress: Not on file  Social Connections: Not on file  Intimate Partner Violence:  Not on file    Allergies:  Allergies  Allergen Reactions   Oxycodone-Acetaminophen Itching   Oxycodone Itching    Medications: Prior to Admission medications   Medication Sig Start Date End Date Taking? Authorizing Provider  Cholecalciferol (VITAMIN D) 125 MCG (5000 UT) CAPS Take 1 tablet by mouth daily.   Yes [provider]  levonorgestrel (MIRENA) 20 MCG/24HR IUD by Intrauterine route.   Yes [provider]  metoprolol succinate (TOPROL-XL) 25 MG 24 hr tablet TAKE 1/2 A TABLET BY MOUTH ONCE DAILY 06/15/17  Yes [provider]  NONFORMULARY OR COMPOUNDED ITEM Take 1 tablet by mouth daily. Naltrexone HCL 4.5mg  one qd   Yes [provider]   nystatin (MYCOSTATIN/NYSTOP) powder Apply topically 2 (two) times daily as needed. 09/26/21  Yes Denetria Luevanos, Courtney Heys, CNM  famotidine (PEPCID) 20 MG tablet Take 1 tablet by mouth daily. 02/06/19 02/06/20  [provider]  LYRICA 75 MG capsule Take by mouth daily. 07/03/17   [provider]  vitamin C (ASCORBIC ACID) 500 MG tablet Take 1 tablet by mouth daily. Patient not taking: Reported on 08/16/2021    [provider]    Physical Exam Vitals: Blood pressure 134/81, pulse 65, height 5\' 9"  (1.753 m), weight 160 lb 3.2 oz (72.7 kg).  General: NAD HEENT: normocephalic, anicteric Thyroid: no enlargement, no palpable nodules Pulmonary: No increased work of breathing, CTAB Cardiovascular: RRR, distal pulses 2+ Breast: Breast symmetrical, no tenderness, no palpable nodules or masses, no skin or nipple retraction present, no nipple discharge.  No axillary or supraclavicular lymphadenopathy. Abdomen: NABS, soft, non-tender, non-distended.  Umbilicus without lesions.  No hepatomegaly, splenomegaly or masses palpable. No evidence of hernia  Genitourinary:  External: Normal external female genitalia.  Normal urethral meatus, normal Bartholin's and Skene's glands.    Vagina: Normal vaginal mucosa, no evidence of prolapse.  Good tone  Cervix: spec exam deferred   Uterus: Non-enlarged, mobile, normal contour.  No CMT  Adnexa: ovaries non-enlarged, no adnexal masses  Rectal: deferred  Lymphatic: no evidence of inguinal lymphadenopathy Extremities: no edema, erythema, or tenderness Neurologic: Grossly intact Psychiatric: mood appropriate, affect full    Assessment: 42 y.o. G2P1102 routine annual exam  Plan: Problem List Items Addressed This Visit   None Visit Diagnoses     UTI symptoms    -  Primary   Relevant Orders   POCT Urinalysis Dipstick (Completed)       1) Mammogram - recommend yearly screening mammogram.  Mammogram Is up to date   2) STI screening   wasoffered and declined  3) ASCCP guidelines and rational discussed.  Patient opts for  every other year   screening interval  4) Contraception - the patient is currently using  IUD.  She is happy with her current form of contraception and plans to continue  5) Colonoscopy  up to date -- Screening recommended starting at age 16 for average risk individuals, age 20 for individuals deemed at increased risk (including African Americans) and recommended to continue until age 47.  For patient age 71-85 individualized approach is recommended.  Gold standard screening is via colonoscopy, Cologuard screening is an acceptable alternative for patient unwilling or unable to undergo colonoscopy.  "Colorectal cancer screening for average?risk adults: 2018 guideline update from the American Cancer Society"CA: A Cancer Journal for Clinicians: Aug 16, 2016   6) Routine healthcare maintenance including cholesterol, diabetes screening discussed managed by PCP  7) No follow-ups on file.  Carie Caddy, CNM  [facility] 12/29/2022, 4:45 PM

## 2023-03-15 ENCOUNTER — Encounter: Payer: Self-pay | Admitting: Licensed Practical Nurse

## 2023-03-27 ENCOUNTER — Ambulatory Visit: Payer: BC Managed Care – PPO | Admitting: Licensed Practical Nurse

## 2023-03-27 VITALS — BP 126/78 | HR 60 | Wt 171.0 lb

## 2023-03-27 DIAGNOSIS — Z30431 Encounter for routine checking of intrauterine contraceptive device: Secondary | ICD-10-CM

## 2023-03-27 NOTE — Progress Notes (Signed)
    GYNECOLOGY OFFICE ENCOUNTER NOTE  History:  43 y.o. [id number] here today for today for IUD string check; Mirena   IUD was placed  12/2018.  She has had a cycle this month and she spotted last month. She has never cycled on the IUD so is here to be sure everything is ok. She has been able to feel her strings, she did have them shortened at one time.   The following portions of the patient's history were reviewed and updated as appropriate: allergies, current medications, past family history, past medical history, past social history, past surgical history and problem list. Last pap smear on 07/2021 was normal, negative HRHPV.  Review of Systems:  Pertinent items are noted in HPI.  Objective:  Physical Exam Blood pressure 126/78, pulse 60, weight 171 lb (77.6 kg). CONSTITUTIONAL: Well-developed, well-nourished female in no acute distress.  NEUROLOGIC: Alert and oriented to person, place, and time. Normal reflexes, muscle tone coordination.  ABDOMEN: Soft, no distention noted.   PELVIC: Normal appearing external genitalia; normal appearing vaginal mucosa and cervix.  IUD strings visualized, about 2 cm in length outside cervix. Done in the presence of a chaperone.  EXTREMITIES: Non-tender, no edema or cyanosis  Assessment & Plan:  Patient to keep IUD in place for up to 8 years; can come in for removal if she desires pregnancy earlier or for any concerning side effects. We could consider removing sooner and inserting a new device if she dislikes her new bleeding profile.     Louvinia Cumbo, Jinnie Jansky, CNM

## 2023-10-24 ENCOUNTER — Other Ambulatory Visit: Payer: Self-pay | Admitting: Family Medicine

## 2023-10-24 DIAGNOSIS — Z1231 Encounter for screening mammogram for malignant neoplasm of breast: Secondary | ICD-10-CM

## 2023-11-05 ENCOUNTER — Other Ambulatory Visit: Payer: Self-pay | Admitting: Medical Genetics

## 2023-11-07 ENCOUNTER — Other Ambulatory Visit
Admission: RE | Admit: 2023-11-07 | Discharge: 2023-11-07 | Disposition: A | Discharge status: Home / Self Care | Payer: Self-pay | Source: Ambulatory Visit | Attending: Medical Genetics | Admitting: Medical Genetics

## 2023-11-13 ENCOUNTER — Ambulatory Visit

## 2023-11-16 LAB — GENECONNECT MOLECULAR SCREEN: Genetic Analysis Overall Interpretation: NEGATIVE

## 2023-12-12 ENCOUNTER — Ambulatory Visit
Admission: RE | Admit: 2023-12-12 | Discharge: 2023-12-12 | Disposition: A | Discharge status: Home / Self Care | Source: Ambulatory Visit | Attending: Family Medicine | Admitting: Family Medicine

## 2023-12-12 DIAGNOSIS — Z1231 Encounter for screening mammogram for malignant neoplasm of breast: Secondary | ICD-10-CM | POA: Diagnosis present

## 2023-12-17 ENCOUNTER — Other Ambulatory Visit: Payer: Self-pay | Admitting: Family Medicine

## 2023-12-17 DIAGNOSIS — R928 Other abnormal and inconclusive findings on diagnostic imaging of breast: Secondary | ICD-10-CM

## 2023-12-18 ENCOUNTER — Ambulatory Visit
Admission: RE | Admit: 2023-12-18 | Discharge: 2023-12-18 | Disposition: A | Discharge status: Home / Self Care | Source: Ambulatory Visit | Attending: Family Medicine | Admitting: Family Medicine

## 2023-12-18 DIAGNOSIS — R928 Other abnormal and inconclusive findings on diagnostic imaging of breast: Secondary | ICD-10-CM | POA: Diagnosis present
